# Patient Record
Sex: Female | Born: 1935 | Race: White | Hispanic: No | Smoking: Former smoker
Health system: Southern US, Community
[De-identification: ages and names within clinical notes are randomized; demographics above are authoritative.]

## PROBLEM LIST (undated history)

## (undated) DIAGNOSIS — E119 Type 2 diabetes mellitus without complications: Secondary | ICD-10-CM

## (undated) DIAGNOSIS — Z889 Allergy status to unspecified drugs, medicaments and biological substances status: Secondary | ICD-10-CM

## (undated) DIAGNOSIS — I10 Essential (primary) hypertension: Secondary | ICD-10-CM

## (undated) DIAGNOSIS — M1990 Unspecified osteoarthritis, unspecified site: Secondary | ICD-10-CM

## (undated) HISTORY — DX: Unspecified osteoarthritis, unspecified site: M19.90

## (undated) HISTORY — PX: CARPAL TUNNEL RELEASE: SHX101

## (undated) HISTORY — PX: BREAST BIOPSY: SHX20

## (undated) HISTORY — PX: ABDOMINAL HYSTERECTOMY: SHX81

## (undated) HISTORY — DX: Essential (primary) hypertension: I10

## (undated) HISTORY — PX: APPENDECTOMY: SHX54

## (undated) HISTORY — PX: BREAST LUMPECTOMY: SHX2

## (undated) HISTORY — PX: PILONIDAL CYST EXCISION: SHX744

---

## 1998-05-05 ENCOUNTER — Ambulatory Visit (HOSPITAL_COMMUNITY): Admission: RE | Admit: 1998-05-05 | Discharge: 1998-05-05 | Payer: Self-pay | Admitting: Family Medicine

## 2000-05-05 ENCOUNTER — Encounter (HOSPITAL_BASED_OUTPATIENT_CLINIC_OR_DEPARTMENT_OTHER): Payer: Self-pay | Admitting: Internal Medicine

## 2000-05-05 ENCOUNTER — Ambulatory Visit (HOSPITAL_COMMUNITY): Admission: RE | Admit: 2000-05-05 | Discharge: 2000-05-05 | Payer: Self-pay | Admitting: Endocrinology

## 2002-04-02 ENCOUNTER — Ambulatory Visit (HOSPITAL_COMMUNITY): Admission: RE | Admit: 2002-04-02 | Discharge: 2002-04-02 | Payer: Self-pay | Admitting: Internal Medicine

## 2002-04-02 ENCOUNTER — Encounter (HOSPITAL_BASED_OUTPATIENT_CLINIC_OR_DEPARTMENT_OTHER): Payer: Self-pay | Admitting: Internal Medicine

## 2005-07-26 ENCOUNTER — Ambulatory Visit (HOSPITAL_COMMUNITY): Admission: RE | Admit: 2005-07-26 | Discharge: 2005-07-26 | Payer: Self-pay | Admitting: Internal Medicine

## 2006-09-12 ENCOUNTER — Ambulatory Visit (HOSPITAL_COMMUNITY): Admission: RE | Admit: 2006-09-12 | Discharge: 2006-09-12 | Payer: Self-pay | Admitting: Internal Medicine

## 2007-10-16 ENCOUNTER — Ambulatory Visit (HOSPITAL_COMMUNITY): Admission: RE | Admit: 2007-10-16 | Discharge: 2007-10-16 | Payer: Self-pay | Admitting: Internal Medicine

## 2009-07-09 ENCOUNTER — Ambulatory Visit (HOSPITAL_COMMUNITY): Admission: RE | Admit: 2009-07-09 | Discharge: 2009-07-09 | Payer: Self-pay | Admitting: Internal Medicine

## 2010-07-27 ENCOUNTER — Ambulatory Visit (HOSPITAL_COMMUNITY): Admission: RE | Admit: 2010-07-27 | Discharge: 2010-07-27 | Payer: Self-pay | Admitting: Internal Medicine

## 2011-08-12 ENCOUNTER — Other Ambulatory Visit (HOSPITAL_COMMUNITY): Payer: Self-pay | Admitting: Internal Medicine

## 2011-08-12 DIAGNOSIS — Z1231 Encounter for screening mammogram for malignant neoplasm of breast: Secondary | ICD-10-CM

## 2011-08-23 ENCOUNTER — Ambulatory Visit (HOSPITAL_COMMUNITY)
Admission: RE | Admit: 2011-08-23 | Discharge: 2011-08-23 | Disposition: A | Discharge status: Home / Self Care | Payer: Medicare Other | Source: Ambulatory Visit | Attending: Internal Medicine | Admitting: Internal Medicine

## 2011-08-23 DIAGNOSIS — Z1231 Encounter for screening mammogram for malignant neoplasm of breast: Secondary | ICD-10-CM | POA: Insufficient documentation

## 2013-04-18 ENCOUNTER — Other Ambulatory Visit (HOSPITAL_COMMUNITY): Payer: Self-pay | Admitting: Internal Medicine

## 2013-04-18 DIAGNOSIS — Z1231 Encounter for screening mammogram for malignant neoplasm of breast: Secondary | ICD-10-CM

## 2013-05-14 ENCOUNTER — Ambulatory Visit (HOSPITAL_COMMUNITY)
Admission: RE | Admit: 2013-05-14 | Discharge: 2013-05-14 | Disposition: A | Discharge status: Home / Self Care | Payer: Medicare Other | Source: Ambulatory Visit | Attending: Internal Medicine | Admitting: Internal Medicine

## 2013-05-14 DIAGNOSIS — Z1231 Encounter for screening mammogram for malignant neoplasm of breast: Secondary | ICD-10-CM

## 2013-05-15 ENCOUNTER — Other Ambulatory Visit: Payer: Self-pay | Admitting: Internal Medicine

## 2013-05-15 DIAGNOSIS — R928 Other abnormal and inconclusive findings on diagnostic imaging of breast: Secondary | ICD-10-CM

## 2013-06-19 ENCOUNTER — Other Ambulatory Visit: Payer: Self-pay | Admitting: Internal Medicine

## 2013-06-19 ENCOUNTER — Ambulatory Visit
Admission: RE | Admit: 2013-06-19 | Discharge: 2013-06-19 | Disposition: A | Discharge status: Home / Self Care | Payer: Medicare Other | Source: Ambulatory Visit | Attending: Internal Medicine | Admitting: Internal Medicine

## 2013-06-19 DIAGNOSIS — R928 Other abnormal and inconclusive findings on diagnostic imaging of breast: Secondary | ICD-10-CM

## 2013-06-20 ENCOUNTER — Ambulatory Visit
Admission: RE | Admit: 2013-06-20 | Discharge: 2013-06-20 | Disposition: A | Discharge status: Home / Self Care | Payer: Medicare Other | Source: Ambulatory Visit | Attending: Internal Medicine | Admitting: Internal Medicine

## 2013-06-20 DIAGNOSIS — R928 Other abnormal and inconclusive findings on diagnostic imaging of breast: Secondary | ICD-10-CM

## 2013-06-21 ENCOUNTER — Other Ambulatory Visit: Payer: Self-pay | Admitting: Internal Medicine

## 2013-06-21 DIAGNOSIS — C50911 Malignant neoplasm of unspecified site of right female breast: Secondary | ICD-10-CM

## 2013-06-22 ENCOUNTER — Telehealth: Payer: Self-pay | Admitting: *Deleted

## 2013-06-22 DIAGNOSIS — C50411 Malignant neoplasm of upper-outer quadrant of right female breast: Secondary | ICD-10-CM | POA: Insufficient documentation

## 2013-06-22 NOTE — Telephone Encounter (Signed)
Called and spoke with patient and confirmed [facility] appt. For 06/27/13 at 12N.  Instructions and contact information given.  No questions or concerns at this time.

## 2013-06-26 ENCOUNTER — Ambulatory Visit
Admission: RE | Admit: 2013-06-26 | Discharge: 2013-06-26 | Disposition: A | Discharge status: Home / Self Care | Payer: Medicare Other | Source: Ambulatory Visit | Attending: Internal Medicine | Admitting: Internal Medicine

## 2013-06-26 DIAGNOSIS — C50911 Malignant neoplasm of unspecified site of right female breast: Secondary | ICD-10-CM

## 2013-06-26 MED ORDER — GADOBENATE DIMEGLUMINE 529 MG/ML IV SOLN
7.0000 mL | Freq: Once | INTRAVENOUS | Status: AC | PRN
  Administered 2013-06-26: 7 mL via INTRAVENOUS

## 2013-06-27 ENCOUNTER — Ambulatory Visit (HOSPITAL_BASED_OUTPATIENT_CLINIC_OR_DEPARTMENT_OTHER): Payer: Medicare Other | Admitting: Oncology

## 2013-06-27 ENCOUNTER — Other Ambulatory Visit (HOSPITAL_BASED_OUTPATIENT_CLINIC_OR_DEPARTMENT_OTHER): Payer: Medicare Other | Admitting: Lab

## 2013-06-27 ENCOUNTER — Ambulatory Visit: Payer: Medicare Other

## 2013-06-27 ENCOUNTER — Ambulatory Visit (HOSPITAL_BASED_OUTPATIENT_CLINIC_OR_DEPARTMENT_OTHER): Payer: Medicare Other | Admitting: General Surgery

## 2013-06-27 ENCOUNTER — Encounter: Payer: Self-pay | Admitting: *Deleted

## 2013-06-27 ENCOUNTER — Ambulatory Visit: Payer: Medicare Other | Admitting: Physical Therapy

## 2013-06-27 ENCOUNTER — Encounter: Payer: Self-pay | Admitting: Oncology

## 2013-06-27 ENCOUNTER — Encounter: Payer: Self-pay | Admitting: Radiation Oncology

## 2013-06-27 ENCOUNTER — Other Ambulatory Visit (INDEPENDENT_AMBULATORY_CARE_PROVIDER_SITE_OTHER): Payer: Self-pay | Admitting: General Surgery

## 2013-06-27 ENCOUNTER — Ambulatory Visit
Admission: RE | Admit: 2013-06-27 | Discharge: 2013-06-27 | Disposition: A | Discharge status: Home / Self Care | Payer: Medicare Other | Source: Ambulatory Visit | Attending: Radiation Oncology | Admitting: Radiation Oncology

## 2013-06-27 ENCOUNTER — Telehealth: Payer: Self-pay | Admitting: Oncology

## 2013-06-27 VITALS — BP 152/64 | HR 69 | Temp 97.4°F | Resp 20 | Ht <= 58 in | Wt 152.9 lb

## 2013-06-27 DIAGNOSIS — C50411 Malignant neoplasm of upper-outer quadrant of right female breast: Secondary | ICD-10-CM

## 2013-06-27 DIAGNOSIS — C50419 Malignant neoplasm of upper-outer quadrant of unspecified female breast: Secondary | ICD-10-CM

## 2013-06-27 DIAGNOSIS — C50911 Malignant neoplasm of unspecified site of right female breast: Secondary | ICD-10-CM

## 2013-06-27 LAB — COMPREHENSIVE METABOLIC PANEL (CC13)
Albumin: 4.1 g/dL (ref 3.5–5.0)
BUN: 34.2 mg/dL — ABNORMAL HIGH (ref 7.0–26.0)
CO2: 24 mEq/L (ref 22–29)
Calcium: 10.9 mg/dL — ABNORMAL HIGH (ref 8.4–10.4)
Chloride: 101 mEq/L (ref 98–109)
Creatinine: 1.4 mg/dL — ABNORMAL HIGH (ref 0.6–1.1)
Glucose: 103 mg/dl (ref 70–140)

## 2013-06-27 LAB — CBC WITH DIFFERENTIAL/PLATELET
Basophils Absolute: 0 10*3/uL (ref 0.0–0.1)
Eosinophils Absolute: 0.1 10*3/uL (ref 0.0–0.5)
HCT: 32.1 % — ABNORMAL LOW (ref 34.8–46.6)
HGB: 11.4 g/dL — ABNORMAL LOW (ref 11.6–15.9)
MCH: 33 pg (ref 25.1–34.0)
MONO#: 0.7 10*3/uL (ref 0.1–0.9)
NEUT#: 3.3 10*3/uL (ref 1.5–6.5)
NEUT%: 52.6 % (ref 38.4–76.8)
WBC: 6.3 10*3/uL (ref 3.9–10.3)
lymph#: 2.2 10*3/uL (ref 0.9–3.3)

## 2013-06-27 NOTE — Progress Notes (Signed)
Radiation Oncology         [phone]) [phone] ________________________________  Initial outpatient Consultation  Name: Kelly Webster MRN: [id number]  Date: 06/27/2013  DOB: 1931/10/30  CC:No primary provider on file.  Hoxworth, Lorne Skeens, MD   REFERRING PHYSICIAN: Johna Sheriff Lorne Skeens, MD  DIAGNOSIS: [id number] invasive mammary carcinoma, right breast, ER/PR positive HER-2/neu neg  HISTORY OF PRESENT ILLNESS::Kelly Webster is a 79 y.o. female who was found on screening mammography to have a right breast mass. This is in the 9:00 position and it measured 8 x 8 mm on ultrasound. She underwent an MRI and the lesion was 1.6 cm, a solitary finding. She underwent a biopsy of this mass which demonstrated invasive mammary carcinoma with carcinoma in situ. The disease is ER/PR positive HER-2/neu negative with the Ki-67 of 17%. She is planning to undergo lumpectomy. She has minimal complaints today.   PREVIOUS RADIATION THERAPY: No  PAST MEDICAL HISTORY:  has a past medical history of Hypertension and Arthritis.    PAST SURGICAL HISTORY: Past Surgical History  Procedure Laterality Date  . Appendectomy    . Abdominal hysterectomy      FAMILY HISTORY: family history includes Bone cancer in her cousin; Colon cancer in her maternal uncle and sister; and Lung cancer in her maternal uncle.  SOCIAL HISTORY:  reports that she quit smoking about 32 years ago. Her smoking use included Cigarettes. She smoked 0.25 packs per day. She does not have any smokeless tobacco history on file. She reports that she does not drink alcohol or use illicit drugs.  ALLERGIES: Demerol  MEDICATIONS:  No current outpatient prescriptions on file.   No current facility-administered medications for this encounter.    REVIEW OF SYSTEMS:  A 15 point review of systems is documented in the electronic medical record. This was obtained by the nursing staff. However, I reviewed this with the patient to discuss relevant findings  and make appropriate changes.  Pertinent items are noted in HPI.   PHYSICAL EXAM:  Vitals with Age-Percentiles 06/27/2013  Length 144.8 cm  Systolic 152  Diastolic 64  Pulse 69  Respiration 20  Weight 69.355 kg  VISIT REPORT   General: Alert and oriented, in no acute distress HEENT: Head is normocephalic.  EOMI. Oropharynx is clear. Neck: Neck is supple, no palpable cervical or supraclavicular lymphadenopathy. Abdomen: Soft, nontender, nondistended, with no rigidity or guarding. Extremities: No cyanosis or edema. Lymphatics: No concerning lymphadenopathy. Skin: No concerning lesions. BREASTS: Post biopsy changes/ecchymoses in LOQ of right breast with underlying 1.5 cm mass, possibly larger than actual tumor due to blood products Psychiatric: Judgment and insight are intact. Affect is appropriate.   LABORATORY DATA:  Lab Results  Component Value Date   WBC 6.3 06/27/2013   HGB 11.4* 06/27/2013   HCT 32.1* 06/27/2013   MCV 93.3 06/27/2013   PLT 273 06/27/2013   CMP     Component Value Date/Time   NA 136 06/27/2013 1222         RADIOGRAPHY: US Breast Right  06/19/2013   *RADIOLOGY REPORT*  Clinical Data:  Called back from screening mammogram for possible mass right breast  DIGITAL DIAGNOSTIC RIGHT MAMMOGRAM  June 19, 2013 AND RIGHT BREAST ULTRASOUND:  Comparison:  May 14, 2013, August 23, 2011, July 27, 2010  Findings:  ACR Breast Density Category scattered fibroglandular tissue  Spot compression CC and MLO views of the right breast are submitted.  There is a spiculated mass in the right breast nine o'clock  position.  Ultrasound is performed, showing angulated hypoechoic lesion at the right breast nine o'clock 7 cm from nipple measuring 0.83 x 0.82 x 0.82 cm.  This correlates to the mammographic finding.  Ultrasound right axilla demonstrates normal right axillary lymph nodes.  IMPRESSION: Highly suspicious findings.  RECOMMENDATION: Ultrasound guided core biopsy right breast mass.   I have discussed the findings and recommendations with the patient. Results were also provided in writing at the conclusion of the visit.  If applicable, a reminder letter will be sent to the patient regarding the next appointment.  BI-RADS CATEGORY 5:  Highly suggestive of malignancy - appropriate action should be taken.   Original Report Authenticated By: Sherian Rein, M.D.   Mr Breast Bilateral W Wo Contrast  06/26/2013   *RADIOLOGY REPORT*  Clinical Data:Recently diagnosed right breast invasive mammary carcinoma with mammary carcinoma in situ. Preoperative evaluation.  BUN and creatinine were obtained on site at [facility] at [address] Results:  BUN 27 mg/dL,  Creatinine 1.2 mg/dL.  BILATERAL BREAST MRI WITH AND WITHOUT CONTRAST  Technique: Multiplanar, multisequence MR images of both breasts were obtained prior to and following the intravenous administration of 7ml of Multihance.  THREE-DIMENSIONAL MR IMAGE RENDERING ON INDEPENDENT WORKSTATION: Three-dimensional MR images were rendered by post-processing  of the original MR data on an independent DynaCad workstation. The three-dimensional MR images were interpreted, and findings are reported in the following complete MRI report for this study.  Comparison:  Recent imaging examinations.  FINDINGS:  Breast composition:  b:  There are scattered areas of fibroglandular density.  Background parenchymal enhancement: Mild to moderate.  Right breast:  There is a 1.6 x 1.4 x 1.2 cm irregular mass located within the middle third of the right breast at the 9 o'clock position.  This is associated with a mixture of wash in /washout and plateau enhancement kinetics.  This is associated with clip artifact related to the recent ultrasound guided core biopsy. There is mild adjacent post biopsy change present laterally located.  There are no additional worrisome enhancing foci within the right breast.  Left breast:  No worrisome enhancing foci.  Lymph  nodes:  No findings worrisome for axillary or internal mammary adenopathy.  Ancillary findings:   no additional findings  IMPRESSION:  1.  1.6 cm irregular enhancing mass located within the middle one third of the right breast at the nine o'clock position corresponding to the recently diagnosed invasive mammary carcinoma. No evidence for adenopathy and no additional findings.  RECOMMENDATION: Treatment plan  BI-RADS CATEGORY 6:  Known biopsy-proven malignancy - appropriate action should be taken.   Original Report Authenticated By: Rolla Plate, M.D.   Mm Digital Diag Ltd R  06/19/2013   *RADIOLOGY REPORT*  Clinical Data:  Called back from screening mammogram for possible mass right breast  DIGITAL DIAGNOSTIC RIGHT MAMMOGRAM  June 19, 2013 AND RIGHT BREAST ULTRASOUND:  Comparison:  May 14, 2013, August 23, 2011, July 27, 2010  Findings:  ACR Breast Density Category scattered fibroglandular tissue  Spot compression CC and MLO views of the right breast are submitted.  There is a spiculated mass in the right breast nine o'clock position.  Ultrasound is performed, showing angulated hypoechoic lesion at the right breast nine o'clock 7 cm from nipple measuring 0.83 x 0.82 x 0.82 cm.  This correlates to the mammographic finding.  Ultrasound right axilla demonstrates normal right axillary lymph nodes.  IMPRESSION: Highly suspicious findings.  RECOMMENDATION: Ultrasound guided core biopsy right  breast mass.  I have discussed the findings and recommendations with the patient. Results were also provided in writing at the conclusion of the visit.  If applicable, a reminder letter will be sent to the patient regarding the next appointment.  BI-RADS CATEGORY 5:  Highly suggestive of malignancy - appropriate action should be taken.   Original Report Authenticated By: Sherian Rein, M.D.   Mm Digital Diagnostic Unilat R  06/19/2013   *RADIOLOGY REPORT*  Clinical Data:  Status post ultrasound guided core biopsy right  breast mass  DIGITAL DIAGNOSTIC RIGHT MAMMOGRAM  Comparison:  Previous exams.  Findings:  Films are performed following right breast ultrasound guided biopsy of mass at the 9 o'clock.  CC and lateral view of the right breast demonstrate ribbon biopsy clip in the area of concern.  IMPRESSION: Post biopsy mammogram demonstrate biopsy clip in the area concern.   Original Report Authenticated By: Sherian Rein, M.D.   Mm Radiologist Eval And Mgmt  06/20/2013   *RADIOLOGY REPORT*  ESTABLISHED PATIENT OFFICE VISIT - LEVEL II [phone])  Chief Complaint:  79 year old female returns for biopsy results of ultrasound-guided right breast biopsy. The patient has no complaints with the biopsy site.  History:  79 year old female with 8 mm irregular hypoechoic mass in the outer right breast, biopsied on 07/20 12/2012.  Exam:  The biopsy site is clean and dry with minimal ecchymosis.  Pathology: INVASIVE MAMMARY CARCINOMA AND MAMMARY CARCINOMA IN SITU.  Assessment and Plan:  Surgery/oncology consultation.  An appointment at the [facility]) has been scheduled for 06/27/2013 and the patient informed.  Bilateral breast MRI, which will be scheduled and the patient informed.   Original Report Authenticated By: Harmon Pier, M.D.   [place] Rt Breast Bx W Loc Dev 1st Lesion Img Bx Spec US Guide  06/19/2013   *RADIOLOGY REPORT*  Clinical Data:  Suspicious mass right breast for biopsy  ULTRASOUND GUIDED CORE BIOPSY OF THE right BREAST  Comparison: Previous exams.  I met with the patient and we discussed the procedure of ultrasound- guided biopsy, including benefits and alternatives.  We discussed the high likelihood of a successful procedure. We discussed the risks of the procedure, including infection, bleeding, tissue injury, clip migration, and inadequate sampling.  Informed written consent was given. The usual time-out protocol was performed immediately prior to the procedure.  Using sterile technique  and 2% Lidocaine as local anesthetic, under direct ultrasound visualization, a 14 gauge  device was used to perform biopsy of spiculated hypoechoic mass at the right breast nine o'clock using a lateral approach.  At the conclusion of the procedure a  tissue marker clip was deployed into the biopsy cavity.  Follow up 2 view mammogram was performed and dictated separately.  IMPRESSION: Ultrasound guided biopsy of right breast.  No apparent complications.   Original Report Authenticated By: Sherian Rein, M.D.      IMPRESSION/PLAN: For the patient's Stage I ER+ breast cancer, we had a thorough discussion about her options for adjuvant therapy. One option would be antiestrogen therapy as discussed with medical oncology. She would take a pill for approximately 5 years. The alternative option would be radiotherapy to the breast. We discussed the risks benefits and side effects of radiotherapy.  She understands that the side effects would likely include some skin irritation and fatigue during the weeks of radiation. There is a risk of late effects which include but are not necessarily limited to cosmetic changes and rare lung toxicity.  After  a thorough discussion, the patient is most enthusiastic about antiestrogen therapy. This is a fine choice and I will therefore see her back PRN, ie if pathologic staging raises increased concerns.  It was a pleasure meeting the patient today.    I spent 20 minutes  face to face with the patient and more than 50% of that time was spent in counseling and/or coordination of care.    __________________________________________   Lonie Peak, MD

## 2013-06-27 NOTE — Progress Notes (Signed)
Kelly Webster [id number] August 16, 1931 79 y.o. 06/27/2013 2:11 PM  CC Dr. Glenna Fellows Dr. Lonie Peak  REASON FOR CONSULTATION:  79 year old female with invasive ductal carcinoma of the right breast being seen in the multidisciplinary breast clinic for discussion of treatment options.  STAGE:   Breast cancer of upper-outer quadrant of right female breast   Primary site: Breast (Right)   Staging method: AJCC 7th Edition   Clinical: Stage IA (T1b, N0, cM0)   Summary: Stage IA (T1b, N0, cM0)  REFERRING PHYSICIAN: Dr. Sharlet Salina Hoxworth  HISTORY OF PRESENT ILLNESS:  Kelly Webster is a 79 y.o. female.  With multiple medical problems including diabetes hyperlipidemia osteoarthritis. Patient underwent a screening mammogram and was found to have a right breast mass at the 9:00 position measuring 8 mm. She subsequently had an MRI of the breasts as well and the tumor measured 1.6 cm. Patient underwent a biopsy that showed invasive ductal carcinoma with ductal carcinoma in situ. The tumor was grade 1 and was ER +100% PR +98% HER-2/neu negative with a Ki-67 of 17%. Patient's case was discussed at the multidisciplinary breast conference. Her radiology and pathology were reviewed in detail. She is now seen in the [facility] clinic for discussion of her treatment options. She is also seen by Dr. Lonie Peak and Dr. Glenna Fellows. Patient herself is without any significant complaints.   Past Medical History: Past Medical History  Diagnosis Date  . Hypertension   . Arthritis     Past Surgical History: Past Surgical History  Procedure Laterality Date  . Appendectomy    . Abdominal hysterectomy      Family History: Family History  Problem Relation Age of Onset  . Colon cancer Sister   . Colon cancer Maternal Uncle   . Lung cancer Maternal Uncle   . Bone cancer Cousin     Social History History  Substance Use Topics  . Smoking status: Former Smoker -- 0.25 packs/day    Types:  Cigarettes    Quit date: 08/28/1980  . Smokeless tobacco: Not on file  . Alcohol Use: No    Allergies: No Known Allergies  Current Medications: Current Outpatient Prescriptions  Medication Sig Dispense Refill  . aspirin 81 MG tablet Take 81 mg by mouth daily.      Marland Kitchen atenolol (TENORMIN) 50 MG tablet Take 50 mg by mouth daily.      . Cetirizine HCl (ZYRTEC PO) Take 1 tablet by mouth daily.      . Chlorpheniramine Maleate (ALLERGY RELIEF PO) Take 1 tablet by mouth daily.      . famotidine (PEPCID) 10 MG tablet Take 10 mg by mouth daily.      Marland Kitchen LORazepam (ATIVAN) 0.5 MG tablet Take 0.5 mg by mouth 2 (two) times daily as needed for anxiety.       No current facility-administered medications for this visit.    OB/GYN History:patient had menarche at age 83 she underwent menopause in 29 she had been on hormone replacement therapy for about 15 years. She discontinued them in 1995. She had her first live birth at 29. Patient has not had a colonoscopy or a bone density scan.  Fertility Discussion:n/a Prior History of Cancer: no  Health Maintenance:  Colonoscopy none Bone Density none Last PAP smear unsure  ECOG PERFORMANCE STATUS: 0 - Asymptomatic  Genetic Counseling/testing: no  REVIEW OF SYSTEMS:  A comprehensive review of systems was negative.  PHYSICAL EXAMINATION: Blood pressure 152/64, pulse 69, temperature 97.4 F (36.3  C), temperature source Oral, resp. rate 20, height 4\' 9"  (1.448 m), weight 152 lb 14.4 oz (69.355 kg).  Patient is well-developed well-nourished female she appears younger than her stated age.  She is in no acute or apparent distress HEENT exam EOMI PERRLA sclerae anicteric no conjunctival pallor oral mucosa is moist neck is supple no palpable adenopathy Lungs clear to auscultation Cardiovascular regular rate rhythm 2/6 murmur is noted Abdomen soft nontender no HSM Extremities no edema Neuro grossly normal Breast exam right breast reveals area of  ecchymosis from biopsy and a palpable hematoma no masses nipple discharge otherwise left breast no masses or nipple discharge    STUDIES/RESULTS: US Breast Right  06/19/2013   *RADIOLOGY REPORT*  Clinical Data:  Called back from screening mammogram for possible mass right breast  DIGITAL DIAGNOSTIC RIGHT MAMMOGRAM  June 19, 2013 AND RIGHT BREAST ULTRASOUND:  Comparison:  May 14, 2013, August 23, 2011, July 27, 2010  Findings:  ACR Breast Density Category scattered fibroglandular tissue  Spot compression CC and MLO views of the right breast are submitted.  There is a spiculated mass in the right breast nine o'clock position.  Ultrasound is performed, showing angulated hypoechoic lesion at the right breast nine o'clock 7 cm from nipple measuring 0.83 x 0.82 x 0.82 cm.  This correlates to the mammographic finding.  Ultrasound right axilla demonstrates normal right axillary lymph nodes.  IMPRESSION: Highly suspicious findings.  RECOMMENDATION: Ultrasound guided core biopsy right breast mass.  I have discussed the findings and recommendations with the patient. Results were also provided in writing at the conclusion of the visit.  If applicable, a reminder letter will be sent to the patient regarding the next appointment.  BI-RADS CATEGORY 5:  Highly suggestive of malignancy - appropriate action should be taken.   Original Report Authenticated By: Sherian Rein, M.D.   Mr Breast Bilateral W Wo Contrast  06/26/2013   *RADIOLOGY REPORT*  Clinical Data:Recently diagnosed right breast invasive mammary carcinoma with mammary carcinoma in situ. Preoperative evaluation.  BUN and creatinine were obtained on site at [facility] at [address] Results:  BUN 27 mg/dL,  Creatinine 1.2 mg/dL.  BILATERAL BREAST MRI WITH AND WITHOUT CONTRAST  Technique: Multiplanar, multisequence MR images of both breasts were obtained prior to and following the intravenous administration of 7ml of Multihance.   THREE-DIMENSIONAL MR IMAGE RENDERING ON INDEPENDENT WORKSTATION: Three-dimensional MR images were rendered by post-processing  of the original MR data on an independent DynaCad workstation. The three-dimensional MR images were interpreted, and findings are reported in the following complete MRI report for this study.  Comparison:  Recent imaging examinations.  FINDINGS:  Breast composition:  b:  There are scattered areas of fibroglandular density.  Background parenchymal enhancement: Mild to moderate.  Right breast:  There is a 1.6 x 1.4 x 1.2 cm irregular mass located within the middle third of the right breast at the 9 o'clock position.  This is associated with a mixture of wash in /washout and plateau enhancement kinetics.  This is associated with clip artifact related to the recent ultrasound guided core biopsy. There is mild adjacent post biopsy change present laterally located.  There are no additional worrisome enhancing foci within the right breast.  Left breast:  No worrisome enhancing foci.  Lymph nodes:  No findings worrisome for axillary or internal mammary adenopathy.  Ancillary findings:   no additional findings  IMPRESSION:  1.  1.6 cm irregular enhancing mass located within the middle  one third of the right breast at the nine o'clock position corresponding to the recently diagnosed invasive mammary carcinoma. No evidence for adenopathy and no additional findings.  RECOMMENDATION: Treatment plan  BI-RADS CATEGORY 6:  Known biopsy-proven malignancy - appropriate action should be taken.   Original Report Authenticated By: Rolla Plate, M.D.   Mm Digital Diag Ltd R  06/19/2013   *RADIOLOGY REPORT*  Clinical Data:  Called back from screening mammogram for possible mass right breast  DIGITAL DIAGNOSTIC RIGHT MAMMOGRAM  June 19, 2013 AND RIGHT BREAST ULTRASOUND:  Comparison:  May 14, 2013, August 23, 2011, July 27, 2010  Findings:  ACR Breast Density Category scattered fibroglandular tissue   Spot compression CC and MLO views of the right breast are submitted.  There is a spiculated mass in the right breast nine o'clock position.  Ultrasound is performed, showing angulated hypoechoic lesion at the right breast nine o'clock 7 cm from nipple measuring 0.83 x 0.82 x 0.82 cm.  This correlates to the mammographic finding.  Ultrasound right axilla demonstrates normal right axillary lymph nodes.  IMPRESSION: Highly suspicious findings.  RECOMMENDATION: Ultrasound guided core biopsy right breast mass.  I have discussed the findings and recommendations with the patient. Results were also provided in writing at the conclusion of the visit.  If applicable, a reminder letter will be sent to the patient regarding the next appointment.  BI-RADS CATEGORY 5:  Highly suggestive of malignancy - appropriate action should be taken.   Original Report Authenticated By: Sherian Rein, M.D.   Mm Digital Diagnostic Unilat R  06/19/2013   *RADIOLOGY REPORT*  Clinical Data:  Status post ultrasound guided core biopsy right breast mass  DIGITAL DIAGNOSTIC RIGHT MAMMOGRAM  Comparison:  Previous exams.  Findings:  Films are performed following right breast ultrasound guided biopsy of mass at the 9 o'clock.  CC and lateral view of the right breast demonstrate ribbon biopsy clip in the area of concern.  IMPRESSION: Post biopsy mammogram demonstrate biopsy clip in the area concern.   Original Report Authenticated By: Sherian Rein, M.D.   Mm Radiologist Eval And Mgmt  06/20/2013   *RADIOLOGY REPORT*  ESTABLISHED PATIENT OFFICE VISIT - LEVEL II [phone])  Chief Complaint:  79 year old female returns for biopsy results of ultrasound-guided right breast biopsy. The patient has no complaints with the biopsy site.  History:  79 year old female with 8 mm irregular hypoechoic mass in the outer right breast, biopsied on 07/20 12/2012.  Exam:  The biopsy site is clean and dry with minimal ecchymosis.  Pathology: INVASIVE MAMMARY CARCINOMA AND  MAMMARY CARCINOMA IN SITU.  Assessment and Plan:  Surgery/oncology consultation.  An appointment at the [facility]idisciplinary [facility]) has been scheduled for 06/27/2013 and the patient informed.  Bilateral breast MRI, which will be scheduled and the patient informed.   Original Report Authenticated By: Harmon Pier, M.D.   [place] Rt Breast Bx W Loc Dev 1st Lesion Img Bx Spec US Guide  06/19/2013   *RADIOLOGY REPORT*  Clinical Data:  Suspicious mass right breast for biopsy  ULTRASOUND GUIDED CORE BIOPSY OF THE right BREAST  Comparison: Previous exams.  I met with the patient and we discussed the procedure of ultrasound- guided biopsy, including benefits and alternatives.  We discussed the high likelihood of a successful procedure. We discussed the risks of the procedure, including infection, bleeding, tissue injury, clip migration, and inadequate sampling.  Informed written consent was given. The usual time-out protocol was performed immediately prior to the  procedure.  Using sterile technique and 2% Lidocaine as local anesthetic, under direct ultrasound visualization, a 14 gauge  device was used to perform biopsy of spiculated hypoechoic mass at the right breast nine o'clock using a lateral approach.  At the conclusion of the procedure a  tissue marker clip was deployed into the biopsy cavity.  Follow up 2 view mammogram was performed and dictated separately.  IMPRESSION: Ultrasound guided biopsy of right breast.  No apparent complications.   Original Report Authenticated By: Sherian Rein, M.D.     LABS:    Chemistry      Component Value Date/Time   NA 136 06/27/2013 1222   K 4.5 06/27/2013 1222   CO2 24 06/27/2013 1222   BUN 34.2* 06/27/2013 1222   CREATININE 1.4* 06/27/2013 1222      Component Value Date/Time   CALCIUM 10.9* 06/27/2013 1222   ALKPHOS 52 06/27/2013 1222   AST 20 06/27/2013 1222   ALT 18 06/27/2013 1222   BILITOT 0.36 06/27/2013 1222      Lab Results  Component  Value Date   WBC 6.3 06/27/2013   HGB 11.4* 06/27/2013   HCT 32.1* 06/27/2013   MCV 93.3 06/27/2013   PLT 273 06/27/2013   PATHOLOGY: ADDITIONAL INFORMATION: ADDENDUM: The tumor is stained for E-cadherin and is positive, supporting a ductal phenotype. (RH:ecj 06/21/2013) Zandra Abts MD Pathologist, Electronic Signature ( Signed 06/21/2013) PROGNOSTIC INDICATORS - ACIS Results: IMMUNOHISTOCHEMICAL AND MORPHOMETRIC ANALYSIS BY THE AUTOMATED CELLULAR IMAGING SYSTEM (ACIS) Estrogen Receptor: 100%, POSITIVE, STRONG STAINING INTENSITY Progesterone Receptor: 98%, POSITIVE, STRONG STAINING INTENSITY Proliferation Marker Ki67: 17% REFERENCE RANGE ESTROGEN RECEPTOR NEGATIVE <1% POSITIVE =>1% PROGESTERONE RECEPTOR NEGATIVE <1% POSITIVE =>1% All controls stained appropriately Pecola Leisure MD Pathologist, Electronic Signature ( Signed 06/22/2013) CHROMOGENIC IN-SITU HYBRIDIZATION 1 of 3 Duplicate copy FINAL for Palecek, Jacquelynne P ([id number]) ADDITIONAL INFORMATION:(continued) Results: HER-2/NEU BY CISH - NO AMPLIFICATION OF HER-2 DETECTED. RESULT RATIO OF HER2: CEP 17 SIGNALS 1.29 AVERAGE HER2 COPY NUMBER PER CELL 1.80 REFERENCE RANGE NEGATIVE HER2/Chr17 Ratio <2.0 and Average HER2 copy number <4.0 EQUIVOCAL HER2/Chr17 Ratio <2.0 and Average HER2 copy number 4.0 and <6.0 POSITIVE HER2/Chr17 Ratio >=2.0 and/or Average HER2 copy number >=6.0 Pecola Leisure MD Pathologist, Electronic Signature ( Signed 06/21/2013) FINAL DIAGNOSIS Diagnosis Breast, right, needle core biopsy, mass - INVASIVE MAMMARY CARCINOMA, SEE COMMENT. - MAMMARY CARCINOMA IN SITU. Microscopic Comment Although the grade of tumor is best assessed at resection, with these biopsies, the invasive tumor is grade I. E-Cadherin stain is pending and will be reported in an addendum. Breast prognostic studies are pending and will be reported in an addendum. The case was reviewed with Dr Raynald Blend, who concurs. (CR:kh  06-20-13) [place] RUND DO Pathologist, Electronic Signature (Case signed 06/20/2013) Specimen [place] ASSESSMENT    79 year old female with  #1 new diagnosis of stage I (T1 NX MX) invasive ductal carcinoma with ductal carcinoma in situ. She was found to have this on a screening mammogram of the right breast.The tumor measured 1.6 cm on MRI of the breasts. The tumor is ER positive PR positive HER-2/neu negative with a low Ki-67.  #2 patient's case was discussed at the multidisciplinary breast conference. Recommendation of lumpectomy followed by adjuvant antiestrogen therapy was discussed in detail. Today I met with the patient and we discussed adjuvant antiestrogen therapy. We discussed  Treatment with tamoxifen versus aromatase inhibitor.we discussed side effects of the different agents that are available to [place]. We discussed length of therapy. She understands that the treatment  would be of curative intent.  Clinical Trial Eligibility: no Multidisciplinary conference discussion yes     PLAN:    #1 patient will proceed with lumpectomy. Do to her age and good prognostic disease sentinel node will not be performed. Patient also does not want a sentinel node.  #2 postoperatively I will plan on seeing her back for consideration of antiestrogen therapy.  #3 patient was seen by Dr. Lonie Peak at this time she did not feel the patient would benefit from adjuvant radiation therapy. However definitive decision will be made at the time of her final pathology.        Discussion: Patient is being treated per NCCN breast cancer care guidelines appropriate for stage.I, sentinel node not performed per clinical decision making, radiation decision will be based on patient's final   Thank you so much for allowing me to participate in the care of Kelly Webster. I will continue to follow up the patient with you and assist in her care.  All questions were answered. The patient knows to call the clinic with  any problems, questions or concerns. We can certainly see the patient much sooner if necessary.  I spent 45 minutes counseling the patient face to face. The total time spent in the appointment was 60 minutes.  Drue Second, MD Medical/Oncology [facility] [phone] (beeper) [phone] (Office)  06/27/2013, 2:12 PM

## 2013-06-27 NOTE — Progress Notes (Signed)
Chief Complaint: New diagnosis of breast cancer  History:    Kelly Webster is a 79 y.o. postmenopausal female referred by Dr. Juel Burrow  for evaluation of recently diagnosed carcinoma of the right breast. She recently presented for a screening mamogram revealing a suspicious mass in the right breast..  Subsequent imaging included diagnostic mamogram showing a spiculated mass and ultrasound showing an 8 mm hypoechoic lesion corresponding to the mammographic abnormality..   A ultrasound guided biopsy was performed on 06/19/2013 with pathology revealing infiltrating ductal carcinoma with associated DCIS of the breast. She is seen now in [facility] for initial treatment planning.  She has experienced no breast symptoms, specifically lump, skin changes or nipple discharge..  She does not have a personal history of any previous breast problems.  MRI has confirmed a lesion measuring 1.6 cm with no other suspicious abnormalities.  Findings at that time were the following:  Tumor size: 1.6 cm  Tumor grade: 1  Estrogen Receptor: positive Progesterone Receptor: positive  Her-2 neu: negative  Lymph node status: negative Neurovascular invasion: no Lymphatic invasion: no  Past Medical History  Diagnosis Date  . Hypertension   . Arthritis     Past Surgical History  Procedure Laterality Date  . Appendectomy    . Abdominal hysterectomy      No current outpatient prescriptions on file.   No current facility-administered medications for this visit.    Family History  Problem Relation Age of Onset  . Colon cancer Sister   . Colon cancer Maternal Uncle   . Lung cancer Maternal Uncle   . Bone cancer Cousin     History   Social History  . Marital Status: Widowed    Spouse Name: N/A    Number of Children: N/A  . Years of Education: N/A   Social History Main Topics  . Smoking status: Former Smoker -- 0.25 packs/day    Types: Cigarettes    Quit date: 08/28/1980  . Smokeless tobacco: Not on file  .  Alcohol Use: No  . Drug Use: No  . Sexually Active: Not on file   Other Topics Concern  . Not on file   Social History Narrative  . No narrative on file     Review of Systems Constitutional: negative Respiratory: negative Cardiovascular: negative Gastrointestinal: positive for constipation Hematologic/lymphatic: negative Musculoskeletal:positive for arthralgias     Objective:  There were no vitals taken for this visit.  General: Alert, well-developed Caucasian female, in no distress Skin: Warm and dry without rash or infection. HEENT: No palpable masses or thyromegaly. Sclera nonicteric. Pupils equal round and reactive. Oropharynx clear. Breasts: there is bruising in the lateral right breast at the 8:30 to 9:00 position status post core biopsy. No other abnormalities palpable in either breast. Lymph nodes: No cervical, supraclavicular, or inguinal nodes palpable. Lungs: Breath sounds clear and equal without increased work of breathing Cardiovascular: Regular rate and rhythm without murmur. No JVD or edema. Peripheral pulses intact. Abdomen: Nondistended. Soft and nontender. No masses palpable. No organomegaly. No palpable hernias. Extremities: No edema or joint swelling or deformity. No chronic venous stasis changes. Neurologic: Alert and fully oriented. Gait normal.   Laboratory data:  CBC:  Lab Results  Component Value Date   WBC 6.3 06/27/2013   RBC 3.45* 06/27/2013   HGB 11.4* 06/27/2013   HCT 32.1* 06/27/2013   PLT 273 06/27/2013  ]  [facility] Labs:  Lab Results  Component Value Date   NA 136 06/27/2013   K  4.5 06/27/2013   CO2 24 06/27/2013   BUN 34.2* 06/27/2013   CREATININE 1.4* 06/27/2013   CALCIUM 10.9* 06/27/2013   PROT 7.8 06/27/2013   BILITOT 0.36 06/27/2013   ALKPHOS 52 06/27/2013   AST 20 06/27/2013   ALT 18 06/27/2013     Assessment  80 y.o. female with a new diagnosis of cancer of the the right breast lower outer quadrant.  Clinical IB, estrogen receptor  positive, progesterone receptor positive and Her2/Neu protein/oncogene negative. I discussed with the patient and family members present today initial surgical treatment options. We discussed options of breast conservation with lumpectomy or total mastectomy and sentinal lymph node biopsy/dissection. Options for reconstruction were discussed. After discussion they have elected to proceed with right partial mastectomy. I did not recommend sentinel lymph node biopsy due to the patient's age and favorable tumor. We discussed the indications and nature of the procedure, and expected recovery, in detail. Surgical risks including anesthetic complications, cardiorespiratory complications, bleeding, infection, wound healing complications, blood clots, lymphedema, local and distant recurrence and possible need for further surgery based on the final pathology was discussed and understood.  Chemotherapy, hormonal therapy and radiation therapy have been discussed. They have been provided with literature regarding the treatment of breast cancer.  All questions were answered. They understand and agree to proceed and we will go ahead with scheduling.  Plan right partial mastectomy with preop needle localization  Mariella Saa MD, FACS  06/27/2013, 1:31 PM

## 2013-06-27 NOTE — Progress Notes (Signed)
Checked in new patient with no financial issues. Didn't ask about living will/POA. Mail and phone for communication.,

## 2013-07-02 ENCOUNTER — Telehealth (INDEPENDENT_AMBULATORY_CARE_PROVIDER_SITE_OTHER): Payer: Self-pay | Admitting: *Deleted

## 2013-07-02 NOTE — Telephone Encounter (Signed)
Patient called to ask about scheduling her surgery.  Patient states "I need to know the date of it".  Explained to patient that once the MD places the orders such as Hoxworth MD did on 06/27/13 then it goes to the surgery schedulers who will call her but that can take a couple of days.  Patient states again she needs to know a date and demands to speak with [facility] CMA.  Explained to patient again that Neysa Bonito CMA is unable to schedule her surgery.  Patient accepted offer to be sent to [facility] voicemail who is a surgery scheduler however prior to transferring patient states "I want Neysa Bonito to call me too".

## 2013-07-02 NOTE — Telephone Encounter (Signed)
Called and spoke to patient.  Patient was seen at Multidisciplinary breast Clinic @ [facility] on 06/27/13.  Patient rec'd a card with my name on it and instructed to call with questions if needed.  Patient was asking about a date for her surgery.  Patient advised that the surgery schedulers will be calling her within the next day or two so they can schedule patient for NL Breast Lumpectomy.  Patient verbalized understanding and will await our surgery schedulers call.

## 2013-07-13 ENCOUNTER — Encounter: Payer: Self-pay | Admitting: *Deleted

## 2013-07-13 NOTE — Progress Notes (Signed)
Mailed after appt letter to pt. 

## 2013-07-17 ENCOUNTER — Telehealth: Payer: Self-pay | Admitting: *Deleted

## 2013-07-17 ENCOUNTER — Encounter: Payer: Self-pay | Admitting: Oncology

## 2013-07-17 NOTE — Telephone Encounter (Signed)
Called and spoke with patient and rescheduled her appt. From 08/20/13 to 08/03/13 at 1245.  Per Dr. Welton Flakes she wants to see patient 1 week after surgery.

## 2013-07-17 NOTE — Progress Notes (Signed)
Patient called and left a message to call back. I called and advised she has copay 40.00 when she comes in to see the dr. She said she has refused chemo and radiation. She said she told them her 1st visit she was not doing and wanted to know if she would be charged for the radiation visit with dr. I advised her possible but would have to check wth insurance for sure.- I verified her dob.

## 2013-07-20 ENCOUNTER — Encounter (HOSPITAL_BASED_OUTPATIENT_CLINIC_OR_DEPARTMENT_OTHER): Payer: Self-pay | Admitting: *Deleted

## 2013-07-23 ENCOUNTER — Encounter (HOSPITAL_BASED_OUTPATIENT_CLINIC_OR_DEPARTMENT_OTHER)
Admission: RE | Admit: 2013-07-23 | Discharge: 2013-07-23 | Disposition: A | Discharge status: Home / Self Care | Payer: Medicare Other | Source: Ambulatory Visit | Attending: General Surgery | Admitting: General Surgery

## 2013-07-23 DIAGNOSIS — Z01810 Encounter for preprocedural cardiovascular examination: Secondary | ICD-10-CM | POA: Insufficient documentation

## 2013-07-23 DIAGNOSIS — Z01818 Encounter for other preprocedural examination: Secondary | ICD-10-CM | POA: Insufficient documentation

## 2013-07-23 DIAGNOSIS — Z01812 Encounter for preprocedural laboratory examination: Secondary | ICD-10-CM | POA: Insufficient documentation

## 2013-07-23 LAB — BASIC METABOLIC PANEL
Chloride: 103 mEq/L (ref 96–112)
GFR calc Af Amer: 64 mL/min — ABNORMAL LOW (ref >90)
GFR calc non Af Amer: 55 mL/min — ABNORMAL LOW (ref >90)
Glucose, Bld: 107 mg/dL — ABNORMAL HIGH (ref 70–99)
Potassium: 4.9 mEq/L (ref 3.5–5.1)
Sodium: 137 mEq/L (ref 135–145)

## 2013-07-27 ENCOUNTER — Ambulatory Visit
Admission: RE | Admit: 2013-07-27 | Discharge: 2013-07-27 | Disposition: A | Discharge status: Home / Self Care | Payer: Medicare Other | Source: Ambulatory Visit | Attending: General Surgery | Admitting: General Surgery

## 2013-07-27 ENCOUNTER — Encounter (HOSPITAL_BASED_OUTPATIENT_CLINIC_OR_DEPARTMENT_OTHER): Payer: Self-pay | Admitting: Certified Registered Nurse Anesthetist

## 2013-07-27 ENCOUNTER — Encounter (HOSPITAL_BASED_OUTPATIENT_CLINIC_OR_DEPARTMENT_OTHER)
Admission: RE | Disposition: A | Discharge status: Home / Self Care | Payer: Self-pay | Source: Ambulatory Visit | Attending: General Surgery

## 2013-07-27 ENCOUNTER — Ambulatory Visit (HOSPITAL_BASED_OUTPATIENT_CLINIC_OR_DEPARTMENT_OTHER): Payer: Medicare Other | Admitting: Certified Registered Nurse Anesthetist

## 2013-07-27 ENCOUNTER — Ambulatory Visit (HOSPITAL_BASED_OUTPATIENT_CLINIC_OR_DEPARTMENT_OTHER)
Admission: RE | Admit: 2013-07-27 | Discharge: 2013-07-27 | Disposition: A | Discharge status: Home / Self Care | Payer: Medicare Other | Source: Ambulatory Visit | Attending: General Surgery | Admitting: General Surgery

## 2013-07-27 DIAGNOSIS — Z87891 Personal history of nicotine dependence: Secondary | ICD-10-CM | POA: Insufficient documentation

## 2013-07-27 DIAGNOSIS — I10 Essential (primary) hypertension: Secondary | ICD-10-CM | POA: Insufficient documentation

## 2013-07-27 DIAGNOSIS — C50911 Malignant neoplasm of unspecified site of right female breast: Secondary | ICD-10-CM

## 2013-07-27 DIAGNOSIS — C50919 Malignant neoplasm of unspecified site of unspecified female breast: Secondary | ICD-10-CM

## 2013-07-27 DIAGNOSIS — D0590 Unspecified type of carcinoma in situ of unspecified breast: Secondary | ICD-10-CM | POA: Insufficient documentation

## 2013-07-27 DIAGNOSIS — M129 Arthropathy, unspecified: Secondary | ICD-10-CM | POA: Insufficient documentation

## 2013-07-27 DIAGNOSIS — Z170 Estrogen receptor positive status [ER+]: Secondary | ICD-10-CM | POA: Insufficient documentation

## 2013-07-27 DIAGNOSIS — C50411 Malignant neoplasm of upper-outer quadrant of right female breast: Secondary | ICD-10-CM

## 2013-07-27 HISTORY — PX: BREAST LUMPECTOMY WITH NEEDLE LOCALIZATION: SHX5759

## 2013-07-27 HISTORY — DX: Type 2 diabetes mellitus without complications: E11.9

## 2013-07-27 HISTORY — DX: Allergy status to unspecified drugs, medicaments and biological substances: Z88.9

## 2013-07-27 LAB — POCT HEMOGLOBIN-HEMACUE: Hemoglobin: 10.1 g/dL — ABNORMAL LOW (ref 12.0–15.0)

## 2013-07-27 SURGERY — BREAST LUMPECTOMY WITH NEEDLE LOCALIZATION
Anesthesia: General | Site: Breast | Laterality: Right | Wound class: Clean

## 2013-07-27 MED ORDER — FENTANYL CITRATE 0.05 MG/ML IJ SOLN
25.0000 ug | INTRAMUSCULAR | Status: DC | PRN
  Administered 2013-07-27: 25 ug via INTRAVENOUS

## 2013-07-27 MED ORDER — HYDROCODONE-ACETAMINOPHEN 5-325 MG PO TABS: 1.0000 | ORAL_TABLET | ORAL | Status: DC | PRN

## 2013-07-27 MED ORDER — BUPIVACAINE-EPINEPHRINE 0.5% -1:200000 IJ SOLN
INTRAMUSCULAR | Status: DC | PRN
  Administered 2013-07-27: 30 mL

## 2013-07-27 MED ORDER — CEFAZOLIN SODIUM-DEXTROSE 2-3 GM-% IV SOLR
2.0000 g | INTRAVENOUS | Status: AC
  Administered 2013-07-27: 2 g via INTRAVENOUS

## 2013-07-27 MED ORDER — ONDANSETRON HCL 4 MG/2ML IJ SOLN: 4.0000 mg | Freq: Once | INTRAMUSCULAR | Status: DC | PRN

## 2013-07-27 MED ORDER — CHLORHEXIDINE GLUCONATE 4 % EX LIQD: 1.0000 "application " | Freq: Once | CUTANEOUS | Status: DC

## 2013-07-27 MED ORDER — MIDAZOLAM HCL 2 MG/2ML IJ SOLN: 1.0000 mg | INTRAMUSCULAR | Status: DC | PRN

## 2013-07-27 MED ORDER — FENTANYL CITRATE 0.05 MG/ML IJ SOLN
INTRAMUSCULAR | Status: DC | PRN
  Administered 2013-07-27: 25 ug via INTRAVENOUS
  Administered 2013-07-27: 50 ug via INTRAVENOUS

## 2013-07-27 MED ORDER — ACETAMINOPHEN 160 MG/5ML PO SOLN: 325.0000 mg | ORAL | Status: DC | PRN

## 2013-07-27 MED ORDER — FENTANYL CITRATE 0.05 MG/ML IJ SOLN: 50.0000 ug | INTRAMUSCULAR | Status: DC | PRN

## 2013-07-27 MED ORDER — PROPOFOL 10 MG/ML IV BOLUS
INTRAVENOUS | Status: DC | PRN
  Administered 2013-07-27: 150 mg via INTRAVENOUS

## 2013-07-27 MED ORDER — OXYCODONE HCL 5 MG/5ML PO SOLN: 5.0000 mg | Freq: Once | ORAL | Status: DC | PRN

## 2013-07-27 MED ORDER — ACETAMINOPHEN 325 MG PO TABS: 325.0000 mg | ORAL_TABLET | ORAL | Status: DC | PRN

## 2013-07-27 MED ORDER — 0.9 % SODIUM CHLORIDE (POUR BTL) OPTIME
TOPICAL | Status: DC | PRN
  Administered 2013-07-27: 200 mL

## 2013-07-27 MED ORDER — DEXAMETHASONE SODIUM PHOSPHATE 4 MG/ML IJ SOLN
INTRAMUSCULAR | Status: DC | PRN
  Administered 2013-07-27: 4 mg via INTRAVENOUS

## 2013-07-27 MED ORDER — ONDANSETRON HCL 4 MG/2ML IJ SOLN
INTRAMUSCULAR | Status: DC | PRN
  Administered 2013-07-27: 4 mg via INTRAVENOUS

## 2013-07-27 MED ORDER — LIDOCAINE HCL (CARDIAC) 20 MG/ML IV SOLN
INTRAVENOUS | Status: DC | PRN
  Administered 2013-07-27: 60 mg via INTRAVENOUS

## 2013-07-27 MED ORDER — OXYCODONE HCL 5 MG PO TABS: 5.0000 mg | ORAL_TABLET | Freq: Once | ORAL | Status: DC | PRN

## 2013-07-27 MED ORDER — LACTATED RINGERS IV SOLN
INTRAVENOUS | Status: DC
  Administered 2013-07-27 (×2): via INTRAVENOUS

## 2013-07-27 SURGICAL SUPPLY — 48 items
ADH SKN CLS APL DERMABOND .7 (GAUZE/BANDAGES/DRESSINGS) ×1
BLADE SURG 10 STRL SS (BLADE) IMPLANT
BLADE SURG 15 STRL LF DISP TIS (BLADE) ×1 IMPLANT
BLADE SURG 15 STRL SS (BLADE) ×2
CANISTER SUCTION 1200CC (MISCELLANEOUS) ×1 IMPLANT
CHLORAPREP W/TINT 26ML (MISCELLANEOUS) ×2 IMPLANT
CLIP TI MEDIUM 6 (CLIP) IMPLANT
CLIP TI WIDE RED SMALL 6 (CLIP) ×2 IMPLANT
CLOTH BEACON ORANGE TIMEOUT ST (SAFETY) ×2 IMPLANT
COVER MAYO STAND STRL (DRAPES) ×2 IMPLANT
COVER TABLE BACK 60X90 (DRAPES) ×2 IMPLANT
DERMABOND ADVANCED (GAUZE/BANDAGES/DRESSINGS) ×1
DERMABOND ADVANCED .7 DNX12 (GAUZE/BANDAGES/DRESSINGS) IMPLANT
DEVICE DUBIN W/COMP PLATE 8390 (MISCELLANEOUS) ×1 IMPLANT
DRAPE PED LAPAROTOMY (DRAPES) ×2 IMPLANT
DRAPE UTILITY XL STRL (DRAPES) ×2 IMPLANT
ELECT COATED BLADE 2.86 ST (ELECTRODE) ×2 IMPLANT
ELECT REM PT RETURN 9FT ADLT (ELECTROSURGICAL) ×2
ELECTRODE REM PT RTRN 9FT ADLT (ELECTROSURGICAL) ×1 IMPLANT
GLOVE BIO SURGEON STRL SZ 6.5 (GLOVE) ×1 IMPLANT
GLOVE BIOGEL PI IND STRL 8 (GLOVE) ×1 IMPLANT
GLOVE BIOGEL PI INDICATOR 8 (GLOVE) ×1
GLOVE EXAM NITRILE MD LF STRL (GLOVE) ×2 IMPLANT
GLOVE SS BIOGEL STRL SZ 7.5 (GLOVE) ×1 IMPLANT
GLOVE SUPERSENSE BIOGEL SZ 7.5 (GLOVE) ×1
GOWN PREVENTION PLUS XLARGE (GOWN DISPOSABLE) ×2 IMPLANT
GOWN PREVENTION PLUS XXLARGE (GOWN DISPOSABLE) ×2 IMPLANT
KIT MARKER MARGIN INK (KITS) ×1 IMPLANT
NDL HYPO 25X1 1.5 SAFETY (NEEDLE) ×1 IMPLANT
NEEDLE HYPO 25X1 1.5 SAFETY (NEEDLE) ×2 IMPLANT
NS IRRIG 1000ML POUR BTL (IV SOLUTION) ×2 IMPLANT
PACK BASIN DAY SURGERY FS (CUSTOM PROCEDURE TRAY) ×2 IMPLANT
PENCIL BUTTON HOLSTER BLD 10FT (ELECTRODE) ×2 IMPLANT
SLEEVE SCD COMPRESS KNEE MED (MISCELLANEOUS) ×2 IMPLANT
STAPLER VISISTAT 35W (STAPLE) IMPLANT
SUT MON AB 3-0 SH 27 (SUTURE)
SUT MON AB 3-0 SH27 (SUTURE) IMPLANT
SUT MON AB 5-0 PS2 18 (SUTURE) ×2 IMPLANT
SUT SILK 3 0 SH 30 (SUTURE) IMPLANT
SUT VIC AB 3-0 SH 27 (SUTURE) ×2
SUT VIC AB 3-0 SH 27X BRD (SUTURE) ×1 IMPLANT
SUT VIC AB 4-0 BRD 54 (SUTURE) IMPLANT
SYR BULB 3OZ (MISCELLANEOUS) IMPLANT
SYR CONTROL 10ML LL (SYRINGE) ×2 IMPLANT
TOWEL OR 17X24 6PK STRL BLUE (TOWEL DISPOSABLE) ×3 IMPLANT
TOWEL OR NON WOVEN STRL DISP B (DISPOSABLE) ×1 IMPLANT
TUBE CONNECTING 20X1/4 (TUBING) ×2 IMPLANT
YANKAUER SUCT BULB TIP NO VENT (SUCTIONS) ×1 IMPLANT

## 2013-07-27 NOTE — Anesthesia Procedure Notes (Signed)
Procedure Name: LMA Insertion Date/Time: 07/27/2013 10:41 AM Performed by: Havanah Nelms D Pre-anesthesia Checklist: Patient identified, Emergency Drugs available, Suction available and Patient being monitored Patient Re-evaluated:Patient Re-evaluated prior to inductionOxygen Delivery Method: Circle System Utilized Preoxygenation: Pre-oxygenation with 100% oxygen Intubation Type: IV induction Ventilation: Mask ventilation without difficulty LMA: LMA inserted LMA Size: 4.0 Number of attempts: 1 Airway Equipment and Method: bite block Placement Confirmation: positive ETCO2 Tube secured with: Tape Dental Injury: Teeth and Oropharynx as per pre-operative assessment

## 2013-07-27 NOTE — Op Note (Signed)
Preoperative Diagnosis: right breast cancer  Postoprative Diagnosis: right breast cancer  Procedure: Procedure(s): BREAST LUMPECTOMY WITH NEEDLE LOCALIZATION   Surgeon: Glenna Fellows T   Assistants: None  Anesthesia:  General LMA anesthesia  Indications: Patient is an 79 year old female with recent diagnosis of clinical T1 B. N0 invasive and in situ cancer of the right breast. We have discussed initial surgical treatment options and risks in detail documented elsewhere and elected to proceed with needle localized lumpectomy.    Procedure Detail:  Following accurate needle localization at the breast Center the patient is brought to the operating room, placed in the supine position on the operating table, and laryngeal mask general anesthesia induced. The right breast was widely sterilely prepped and draped. She received preoperative IV antibiotics. PAS were in place. Patient time out was performed and correct procedure verified. A curvilinear incision was made just anterior to the wire insertion site which was lateral. Dissection was carried down through the subcutaneous tissue to the breast capsule. The wire was brought into the incision. At this point there was a small palpable mass. A generous portion of breast tissue was excised around the shaft and the tip of the wire in an effort to obtain negative margins. The specimen mammogram showed the clip and mass centrally located in the specimen. Palpation of the specimen showed the lateral margin to be a little closer than the others that I then reexcised the lateral margin was oriented and sent for permanent pathology. The wound was infiltrated with Marcaine. It was irrigated and hemostasis obtained. The cavity was marked with clips. The deep and subcutaneous tissue was closed with interrupted 3-0 Vicryl and the skin with subcuticular 5-0 Monocryl and Dermabond. Sponge needle and instrument counts were correct.   Estimated Blood Loss:   Minimal         Drains: none  Blood Given: none          Specimens: #1 right breast lumpectomy #2 further lateral margin right breast lumpectomy        Complications:  * No complications entered in OR log *         Disposition: PACU - hemodynamically stable.         Condition: stable

## 2013-07-27 NOTE — Anesthesia Preprocedure Evaluation (Addendum)
Anesthesia Evaluation  Patient identified by MRN, date of birth, ID band Patient awake    Reviewed: Allergy & Precautions, NPO status   Airway Mallampati: II      Dental  (+) Implants and Dental Advisory Given   Pulmonary  breath sounds clear to auscultation        Cardiovascular hypertension, Pt. on medications Rhythm:Regular Rate:Normal     Neuro/Psych    GI/Hepatic   Endo/Other  diabetes, Type 2, Oral Hypoglycemic Agents  Renal/GU      Musculoskeletal   Abdominal   Peds  Hematology   Anesthesia Other Findings   Reproductive/Obstetrics                          Anesthesia Physical Anesthesia Plan  ASA: III  Anesthesia Plan: General   Post-op Pain Management:    Induction: Intravenous  Airway Management Planned: LMA  Additional Equipment:   Intra-op Plan:   Post-operative Plan:   Informed Consent: I have reviewed the patients History and Physical, chart, labs and discussed the procedure including the risks, benefits and alternatives for the proposed anesthesia with the patient or authorized representative who has indicated his/her understanding and acceptance.   Dental advisory given  Plan Discussed with: CRNA and Anesthesiologist  Anesthesia Plan Comments: (R. Breast Ca Htn Type 2 DM glucose 99  Plan GA with LMA  Kipp Brood, MD)        Anesthesia Quick Evaluation

## 2013-07-27 NOTE — Transfer of Care (Signed)
Immediate Anesthesia Transfer of Care Note  Patient: Kelly Webster  Procedure(s) Performed: Procedure(s): BREAST LUMPECTOMY WITH NEEDLE LOCALIZATION (Right)  Patient Location: PACU  Anesthesia Type:General  Level of Consciousness: awake, alert , oriented and patient cooperative  Airway & Oxygen Therapy: Patient Spontanous Breathing and Patient connected to face mask oxygen  Post-op Assessment: Report given to PACU RN and Post -op Vital signs reviewed and stable  Post vital signs: Reviewed and stable  Complications: No apparent anesthesia complications

## 2013-07-27 NOTE — H&P (View-Only) (Signed)
Chief Complaint: New diagnosis of breast cancer  History:    Kelly Webster is a 79 y.o. postmenopausal female referred by Dr. Lin  for evaluation of recently diagnosed carcinoma of the right breast. She recently presented for a screening mamogram revealing a suspicious mass in the right breast..  Subsequent imaging included diagnostic mamogram showing a spiculated mass and ultrasound showing an 8 mm hypoechoic lesion corresponding to the mammographic abnormality..   A ultrasound guided biopsy was performed on 06/19/2013 with pathology revealing infiltrating ductal carcinoma with associated DCIS of the breast. She is seen now in [facility] for initial treatment planning.  She has experienced no breast symptoms, specifically lump, skin changes or nipple discharge..  She does not have a personal history of any previous breast problems.  MRI has confirmed a lesion measuring 1.6 cm with no other suspicious abnormalities.  Findings at that time were the following:  Tumor size: 1.6 cm  Tumor grade: 1  Estrogen Receptor: positive Progesterone Receptor: positive  Her-2 neu: negative  Lymph node status: negative Neurovascular invasion: no Lymphatic invasion: no  Past Medical History  Diagnosis Date  . Hypertension   . Arthritis     Past Surgical History  Procedure Laterality Date  . Appendectomy    . Abdominal hysterectomy      No current outpatient prescriptions on file.   No current facility-administered medications for this visit.    Family History  Problem Relation Age of Onset  . Colon cancer Sister   . Colon cancer Maternal Uncle   . Lung cancer Maternal Uncle   . Bone cancer Cousin     History   Social History  . Marital Status: Widowed    Spouse Name: N/A    Number of Children: N/A  . Years of Education: N/A   Social History Main Topics  . Smoking status: Former Smoker -- 0.25 packs/day    Types: Cigarettes    Quit date: 08/28/1980  . Smokeless tobacco: Not on file  .  Alcohol Use: No  . Drug Use: No  . Sexually Active: Not on file   Other Topics Concern  . Not on file   Social History Narrative  . No narrative on file     Review of Systems Constitutional: negative Respiratory: negative Cardiovascular: negative Gastrointestinal: positive for constipation Hematologic/lymphatic: negative Musculoskeletal:positive for arthralgias     Objective:  There were no vitals taken for this visit.  General: Alert, well-developed Caucasian female, in no distress Skin: Warm and dry without rash or infection. HEENT: No palpable masses or thyromegaly. Sclera nonicteric. Pupils equal round and reactive. Oropharynx clear. Breasts: there is bruising in the lateral right breast at the 8:30 to 9:00 position status post core biopsy. No other abnormalities palpable in either breast. Lymph nodes: No cervical, supraclavicular, or inguinal nodes palpable. Lungs: Breath sounds clear and equal without increased work of breathing Cardiovascular: Regular rate and rhythm without murmur. No JVD or edema. Peripheral pulses intact. Abdomen: Nondistended. Soft and nontender. No masses palpable. No organomegaly. No palpable hernias. Extremities: No edema or joint swelling or deformity. No chronic venous stasis changes. Neurologic: Alert and fully oriented. Gait normal.   Laboratory data:  CBC:  Lab Results  Component Value Date   WBC 6.3 06/27/2013   RBC 3.45* 06/27/2013   HGB 11.4* 06/27/2013   HCT 32.1* 06/27/2013   PLT 273 06/27/2013  ]  [facility] Labs:  Lab Results  Component Value Date   NA 136 06/27/2013   K   4.5 06/27/2013   CO2 24 06/27/2013   BUN 34.2* 06/27/2013   CREATININE 1.4* 06/27/2013   CALCIUM 10.9* 06/27/2013   PROT 7.8 06/27/2013   BILITOT 0.36 06/27/2013   ALKPHOS 52 06/27/2013   AST 20 06/27/2013   ALT 18 06/27/2013     Assessment  79 y.o. female with a new diagnosis of cancer of the the right breast lower outer quadrant.  Clinical IB, estrogen receptor  positive, progesterone receptor positive and Her2/Neu protein/oncogene negative. I discussed with the patient and family members present today initial surgical treatment options. We discussed options of breast conservation with lumpectomy or total mastectomy and sentinal lymph node biopsy/dissection. Options for reconstruction were discussed. After discussion they have elected to proceed with right partial mastectomy. I did not recommend sentinel lymph node biopsy due to the patient's age and favorable tumor. We discussed the indications and nature of the procedure, and expected recovery, in detail. Surgical risks including anesthetic complications, cardiorespiratory complications, bleeding, infection, wound healing complications, blood clots, lymphedema, local and distant recurrence and possible need for further surgery based on the final pathology was discussed and understood.  Chemotherapy, hormonal therapy and radiation therapy have been discussed. They have been provided with literature regarding the treatment of breast cancer.  All questions were answered. They understand and agree to proceed and we will go ahead with scheduling.  Plan right partial mastectomy with preop needle localization  Kelly Webster T [name] MD, FACS  06/27/2013, 1:31 PM 

## 2013-07-27 NOTE — Anesthesia Postprocedure Evaluation (Signed)
  Anesthesia Post-op Note  Patient: Kelly Webster  Procedure(s) Performed: Procedure(s): BREAST LUMPECTOMY WITH NEEDLE LOCALIZATION (Right)  Patient Location: PACU  Anesthesia Type:General  Level of Consciousness: awake, alert  and oriented  Airway and Oxygen Therapy: Patient Spontanous Breathing  Post-op Pain: mild  Post-op Assessment: Post-op Vital signs reviewed  Post-op Vital Signs: stable  Complications: No apparent anesthesia complications

## 2013-07-27 NOTE — Interval H&P Note (Signed)
History and Physical Interval Note:  07/27/2013 10:28 AM  Kelly Webster  has presented today for surgery, with the diagnosis of right breast cancer  The various methods of treatment have been discussed with the patient and family. After consideration of risks, benefits and other options for treatment, the patient has consented to  Procedure(s): BREAST LUMPECTOMY WITH NEEDLE LOCALIZATION (Right) as a surgical intervention .  The patient's history has been reviewed, patient examined, no change in status, stable for surgery.  I have reviewed the patient's chart and labs.  Questions were answered to the patient's satisfaction.     [name]

## 2013-07-31 ENCOUNTER — Encounter (HOSPITAL_BASED_OUTPATIENT_CLINIC_OR_DEPARTMENT_OTHER): Payer: Self-pay | Admitting: General Surgery

## 2013-07-31 ENCOUNTER — Telehealth (INDEPENDENT_AMBULATORY_CARE_PROVIDER_SITE_OTHER): Payer: Self-pay | Admitting: General Surgery

## 2013-07-31 NOTE — Telephone Encounter (Signed)
Called pt and discussed pathology

## 2013-08-03 ENCOUNTER — Encounter: Payer: Self-pay | Admitting: Oncology

## 2013-08-03 ENCOUNTER — Telehealth: Payer: Self-pay | Admitting: Oncology

## 2013-08-03 ENCOUNTER — Ambulatory Visit (HOSPITAL_BASED_OUTPATIENT_CLINIC_OR_DEPARTMENT_OTHER): Payer: Medicare Other | Admitting: Oncology

## 2013-08-03 VITALS — BP 145/65 | HR 67 | Temp 97.9°F | Resp 18 | Ht <= 58 in | Wt 151.6 lb

## 2013-08-03 DIAGNOSIS — C50411 Malignant neoplasm of upper-outer quadrant of right female breast: Secondary | ICD-10-CM

## 2013-08-03 DIAGNOSIS — C50419 Malignant neoplasm of upper-outer quadrant of unspecified female breast: Secondary | ICD-10-CM

## 2013-08-03 MED ORDER — TAMOXIFEN CITRATE 20 MG PO TABS: 20.0000 mg | ORAL_TABLET | Freq: Every day | ORAL | Status: DC

## 2013-08-03 NOTE — Patient Instructions (Addendum)
Proceed with tamoxifen 20 mg  Daily  We discussed the pros and cons about taking the tamoxifen . More information is as below  I will see you back in 3 months  Tamoxifen oral tablet What is this medicine? TAMOXIFEN (ta MOX i fen) blocks the effects of estrogen. It is commonly used to treat breast cancer. It is also used to decrease the chance of breast cancer coming back in women who have received treatment for the disease. It may also help prevent breast cancer in women who have a high risk of developing breast cancer. This medicine may be used for other purposes; ask your health care provider or pharmacist if you have questions. What should I tell my health care provider before I take this medicine? They need to know if you have any of these conditions: -blood clots -blood disease -cataracts or impaired eyesight -endometriosis -high calcium levels -high cholesterol -irregular menstrual cycles -liver disease -stroke -uterine fibroids -an unusual or allergic reaction to tamoxifen, other medicines, foods, dyes, or preservatives -pregnant or trying to get pregnant -breast-feeding How should I use this medicine? Take this medicine by mouth with a glass of water. Follow the directions on the prescription label. You can take it with or without food. Take your medicine at regular intervals. Do not take your medicine more often than directed. Do not stop taking except on your doctor's advice. A special MedGuide will be given to you by the pharmacist with each prescription and refill. Be sure to read this information carefully each time. Talk to your pediatrician regarding the use of this medicine in children. While this drug may be prescribed for selected conditions, precautions do apply. Overdosage: If you think you have taken too much of this medicine contact a poison control center or emergency room at once. NOTE: This medicine is only for you. Do not share this medicine with others. What  if I miss a dose? If you miss a dose, take it as soon as you can. If it is almost time for your next dose, take only that dose. Do not take double or extra doses. What may interact with this medicine? -aminoglutethimide -bromocriptine -chemotherapy drugs -female hormones, like estrogens and birth control pills -letrozole -medroxyprogesterone -phenobarbital -rifampin -warfarin This list may not describe all possible interactions. Give your health care provider a list of all the medicines, herbs, non-prescription drugs, or dietary supplements you use. Also tell them if you smoke, drink alcohol, or use illegal drugs. Some items may interact with your medicine. What should I watch for while using this medicine? Visit your doctor or health care professional for regular checks on your progress. You will need regular pelvic exams, breast exams, and mammograms. If you are taking this medicine to reduce your risk of getting breast cancer, you should know that this medicine does not prevent all types of breast cancer. If breast cancer or other problems occur, there is no guarantee that it will be found at an early stage. Do not become pregnant while taking this medicine or for 2 months after stopping this medicine. Stop taking this medicine if you get pregnant or think you are pregnant and contact your doctor. This medicine may harm your unborn baby. Women who can possibly become pregnant should use birth control methods that do not use hormones during tamoxifen treatment and for 2 months after therapy has stopped. Talk with your health care provider for birth control advice. Do not breast feed while taking this medicine. What side effects  may I notice from receiving this medicine? Side effects that you should report to your doctor or health care professional as soon as possible: -changes in vision (blurred vision) -changes in your menstrual cycle -difficulty breathing or shortness of breath -difficulty  walking or talking -new breast lumps -numbness -pelvic pain or pressure -redness, blistering, peeling or loosening of the skin, including inside the mouth -skin rash or itching (hives) -sudden chest pain -swelling of lips, face, or tongue -swelling, pain or tenderness in your calf or leg -unusual bruising or bleeding -vaginal discharge that is bloody, brown, or rust -weakness -yellowing of the whites of the eyes or skin Side effects that usually do not require medical attention (report to your doctor or health care professional if they continue or are bothersome): -fatigue -hair loss, although uncommon and is usually mild -headache -hot flashes -impotence (in men) -nausea, vomiting (mild) -vaginal discharge (white or clear) This list may not describe all possible side effects. Call your doctor for medical advice about side effects. You may report side effects to FDA at 1-800-FDA-1088. Where should I keep my medicine? Keep out of the reach of children. Store at room temperature between 20 and 25 degrees C (68 and 77 degrees F). Protect from light. Keep container tightly closed. Throw away any unused medicine after the expiration date. NOTE: This sheet is a summary. It may not cover all possible information. If you have questions about this medicine, talk to your doctor, pharmacist, or health care provider.  2012, [employer]. (08/01/2008 12:01:56 PM)

## 2013-08-03 NOTE — Telephone Encounter (Signed)
, °

## 2013-08-10 ENCOUNTER — Telehealth: Payer: Self-pay | Admitting: *Deleted

## 2013-08-10 ENCOUNTER — Encounter (INDEPENDENT_AMBULATORY_CARE_PROVIDER_SITE_OTHER): Payer: Self-pay | Admitting: General Surgery

## 2013-08-10 ENCOUNTER — Encounter: Payer: Self-pay | Admitting: Oncology

## 2013-08-10 ENCOUNTER — Ambulatory Visit (INDEPENDENT_AMBULATORY_CARE_PROVIDER_SITE_OTHER): Payer: Medicare Other | Admitting: General Surgery

## 2013-08-10 VITALS — BP 130/64 | HR 68 | Resp 14 | Ht <= 58 in | Wt 150.8 lb

## 2013-08-10 DIAGNOSIS — C50411 Malignant neoplasm of upper-outer quadrant of right female breast: Secondary | ICD-10-CM

## 2013-08-10 DIAGNOSIS — C50419 Malignant neoplasm of upper-outer quadrant of unspecified female breast: Secondary | ICD-10-CM

## 2013-08-10 NOTE — Telephone Encounter (Signed)
Pt left vm requesting financial services.  I called pt back and left vm requesting pt to call back.  I also called [facility] to inform of pts concerns and gave her pt name and DOB to reach out to pt.  Left contact information for pt to return my call.

## 2013-08-10 NOTE — Progress Notes (Signed)
Kelly Webster had advised to call her. She had already called and left me a message. I called her and left a message at [phone]

## 2013-08-10 NOTE — Progress Notes (Signed)
History: Patient returns for her first office visit status post right breast lumpectomy. She is doing well without complaints. We had previously reviewed her pathology which revealed a 1.3 cm low-grade invasive ductal carcinoma with margins widely negative. She has been back to the cancer center and as per preoperative planning she has been started on tamoxifen and will not receive radiation.  Exam: Right breast incision is well healed with minimal expected induration.  Assessment and plan: Doing well postop lumpectomy. Started on tamoxifen. I will see her back for long-term followup in 6 months or sooner if needed.

## 2013-08-19 NOTE — Progress Notes (Signed)
OFFICE PROGRESS NOTE  CC  Garlan Fillers, MD [address] [facility], [place]. [place] [address] Dr. Glenna Fellows  Dr. Lonie Peak   DIAGNOSIS: 79 year old female with invasive ductal carcinoma of the right breast being seen in the multidisciplinary breast clinic  STAGE:  Breast cancer of upper-outer quadrant of right female breast  Primary site: Breast (Right)  Staging method: AJCC 7th Edition  Clinical: Stage IA (T1b, N0, cM0)  Summary: Stage IA (T1b, N0, cM0)   PRIOR THERAPY: #1Patient underwent a screening mammogram and was found to have a right breast mass at the 9:00 position measuring 8 mm. She subsequently had an MRI of the breasts as well and the tumor measured 1.6 cm. Patient underwent a biopsy that showed invasive ductal carcinoma with ductal carcinoma in situ. The tumor was grade 1 and was ER +100% PR +98% HER-2/neu negative with a Ki-67 of 17%  #2 patient underwent a right breast lumpectomy and performed on 07/27/2013. The final pathology revealed a 1.3 cm invasive ductal carcinoma ER/PR positive HER-2/neu negative with a Ki-67 of 17% pathologic staging T1 C. N0.  #3 patient to begin adjuvant antiestrogen therapy with tamoxifen 20 milligrams daily for a total of 5 years. Risks and benefits and side effects of treatment were discussed with the patient.  #4 patient will not receive radiation therapy her radiation oncology recommendations.  CURRENT THERAPY:Tamoxifen 20 mg daily   INTERVAL HISTORY: Kelly Webster 79 y.o. female returns forfollowup visit after having her right lumpectomy performed. Overall she's doing well. She tolerated the surgery without any significant problems. Her incision site is healing well. Today she is really without any complaints no nausea vomiting fevers chills night sweats headaches shortness of breath chest pains palpitations. She has some tenderness in the breast site otherwise no other problems. Remainder of  the 10 point review of systems is negative.  MEDICAL HISTORY: Past Medical History  Diagnosis Date  . Hypertension   . Diabetes mellitus without complication   . Hx of seasonal allergies   . Arthritis     gouty arthritis knees and hands    ALLERGIES:  is allergic to demerol.  MEDICATIONS:  Current Outpatient Prescriptions  Medication Sig Dispense Refill  . Ascorbic Acid (VITAMIN C) 1000 MG tablet Take 1,000 mg by mouth daily.      Marland Kitchen BIOTIN 5000 PO Take 2 tablets by mouth 2 (two) times daily.      . fenofibrate 160 MG tablet Take 160 mg by mouth daily. 1/2 of a tablet for total of 80mg  daily      . Fish Oil-Cholecalciferol (FISH OIL + D3) 1200-1000 MG-UNIT CAPS Take 2 tablets by mouth 2 (two) times daily.      Marland Kitchen GLUCOSAMINE-CHONDROITIN DS PO Take 2 tablets by mouth daily.      Marland Kitchen ibuprofen (ADVIL,MOTRIN) 200 MG tablet Take 200 mg by mouth every 6 (six) hours as needed for pain.      Marland Kitchen loratadine (CLARITIN) 10 MG tablet Take 10 mg by mouth daily.      Marland Kitchen losartan-hydrochlorothiazide (HYZAAR) 50-12.5 MG per tablet Take 1 tablet by mouth daily.      . metFORMIN (GLUCOPHAGE) 500 MG tablet Take 500 mg by mouth daily. 1/2 of tablet daily for 250mg  total      . Multiple Vitamin (MULTI-VITAMIN PO) Take 1 tablet by mouth daily.      Marland Kitchen VITAMIN D, ERGOCALCIFEROL, PO Take 10,000 Units by mouth 3 (three) times a week.      Marland Kitchen  Vitamin E 400 UNITS TABS Take 1 tablet by mouth daily.      Marland Kitchen HYDROcodone-acetaminophen (NORCO/VICODIN) 5-325 MG per tablet Take 1-2 tablets by mouth every 4 (four) hours as needed for pain.  30 tablet  1  . tamoxifen (NOLVADEX) 20 MG tablet Take 1 tablet (20 mg total) by mouth daily.  90 tablet  12   No current facility-administered medications for this visit.    SURGICAL HISTORY:  Past Surgical History  Procedure Laterality Date  . Pilonidal cyst excision      1957  . Appendectomy      1967  . Abdominal hysterectomy      1981  . Carpal tunnel release Left     1988   . Breast lumpectomy with needle localization Right 07/27/2013    Procedure: BREAST LUMPECTOMY WITH NEEDLE LOCALIZATION;  Surgeon: Mariella Saa, MD;  Location: [facility];  Service: General;  Laterality: Right;    REVIEW OF SYSTEMS:  Pertinent items are noted in HPI.   HEALTH MAINTENANCE:    PHYSICAL EXAMINATION: Blood pressure 145/65, pulse 67, temperature 97.9 F (36.6 C), temperature source Oral, resp. rate 18, height 4\' 9"  (1.448 m), weight 151 lb 9.6 oz (68.765 kg). Body mass index is 32.8 kg/(m^2). ECOG PERFORMANCE STATUS: 0 - Asymptomatic   General appearance: alert, cooperative and appears stated age Neck: no adenopathy, no carotid bruit, no JVD, supple, symmetrical, trachea midline and thyroid not enlarged, symmetric, no tenderness/mass/nodules Lymph nodes: Cervical, supraclavicular, and axillary nodes normal. Resp: clear to auscultation bilaterally and normal percussion bilaterally Back: negative, range of motion normal, symmetric, no curvature. ROM normal. No CVA tenderness. Cardio: regular rate and rhythm, S1, S2 normal, no murmur, click, rub or gallop GI: soft, non-tender; bowel sounds normal; no masses,  no organomegaly Extremities: extremities normal, atraumatic, no cyanosis or edema Neurologic: Grossly normal Right breast no masses nipple discharge Left breast reveals well-healed surgical scar no nodularity no masses.  LABORATORY DATA: Lab Results  Component Value Date   WBC 6.3 06/27/2013   HGB 10.1* 07/27/2013   HCT 32.1* 06/27/2013   MCV 93.3 06/27/2013   PLT 273 06/27/2013      Chemistry      Component Value Date/Time   NA 137 07/23/2013 0900   NA 136 06/27/2013 1222   K 4.9 07/23/2013 0900   K 4.5 06/27/2013 1222   CL 103 07/23/2013 0900   CO2 26 07/23/2013 0900   CO2 24 06/27/2013 1222   BUN 26* 07/23/2013 0900   BUN 34.2* 06/27/2013 1222   CREATININE 0.95 07/23/2013 0900   CREATININE 1.4* 06/27/2013 1222      Component Value Date/Time    CALCIUM 10.7* 07/23/2013 0900   CALCIUM 10.9* 06/27/2013 1222   ALKPHOS 52 06/27/2013 1222   AST 20 06/27/2013 1222   ALT 18 06/27/2013 1222   BILITOT 0.36 06/27/2013 1222    ADDITIONAL INFORMATION: 1. CHROMOGENIC IN-SITU HYBRIDIZATION Results: HER-2/NEU BY CISH - NO AMPLIFICATION OF HER-2 DETECTED. RESULT RATIO OF HER2: CEP 17 SIGNALS 1.32 AVERAGE HER2 COPY NUMBER PER CELL 2.90 REFERENCE RANGE NEGATIVE HER2/Chr17 Ratio <2.0 and Average HER2 copy number <4.0 EQUIVOCAL HER2/Chr17 Ratio <2.0 and Average HER2 copy number 4.0 and <6.0 POSITIVE HER2/Chr17 Ratio >=2.0 and/or Average HER2 copy number >=6.0 Jimmy Picket MD Pathologist, Electronic Signature ( Signed 08/06/2013) FINAL DIAGNOSIS Diagnosis 1. Breast, lumpectomy, right - INVASIVE DUCTAL CARCINOMA, GRADE I/III, SPANNING 1.3 CM. - THE SURGICAL RESECTION MARGINS ARE NEGATIVE FOR CARCINOMA. - SEE ONCOLOGY  TABLE BELOW. 2. Breast, excision, additional lateral margin - BENIGN BREAST PARENCHYMA. - THERE IS NO EVIDENCE OF MALIGNANCY. - SEE COMMENT. Microscopic Comment 1. BREAST, INVASIVE TUMOR, WITHOUT LYMPH NODE SAMPLING 1 of 3 FINAL for DOMINIK, LAURICELLA P [phone]) Microscopic Comment(continued) Specimen: Right breast without lymph node sampling Procedure: Lumpectomy and additional lateral margin excision Histologic type: Ductal Grade: I Tubule formation: 2 Nuclear pleomorphism: 1 Mitotic: 1 Tumor size (gross measurement): 1.3 cm Margins: Negative for carcinoma Invasive, distance to closest margin: Greater than 0.2 cm to all margins (glass slide measurements) Lymphovascular invasion: Not identified Ductal carcinoma in situ: Not identified Lobular neoplasia: Not identified Tumor focality: Unifocal Treatment effect: N/A Extent of tumor: Confined to breast parenchyma Breast prognostic profile: [id number] Estrogen receptor: 100%, strong staining intensity Progesterone receptor: 98%, strong staining intensity Her 2  neu: No amplification was detected. The ratio was 1.29. Her 2 neu by CISH will be repeated on the current case and the results reported separately. Ki-67: 17% TNM: pT1c, pNX (JBK:kh 07-31-13) 2. The surgical resection margin(s) of the specimen were inked and microscopically evaluated. Pecola Leisure MD Pathologist, Electronic Signature (Case signed 07/31/2013) Specimen Gross and Clinical Information Specimen(s) Obtained: 1. Breast, lumpectomy, right 2. Breast, excision, additional lateral margin Specimen Clinical Informatio   RADIOGRAPHIC STUDIES:  Mm Rt Plc Breast Loc Dev   1st Lesion  Inc Mammo Guide  07/27/2013   *RADIOLOGY REPORT*  Clinical Data: Recent diagnosis of right breast cancer.  NEEDLE LOCALIZATION WITH MAMMOGRAPHIC GUIDANCE AND SPECIMEN RADIOGRAPH  Comparison:  Previous exam(s).  Patient presents for needle localization prior to right breast lumpectomy. I met with the patient and we discussed the procedure of needle localization including benefits and alternatives. We discussed the high likelihood of a successful procedure. We discussed the risks of the procedure, including infection, bleeding, tissue injury, and further surgery. Informed, written consent was given. The usual time-out protocol was performed immediately prior to the procedure.  Using mammographic guidance, sterile technique, 2% lidocaine and a 7 cm modified Kopans needle, the ribbon shaped tissue marker clip and the mass in the outer right breast were localized using a lateral approach.  The films are marked for Dr. Johna Sheriff.  Specimen radiograph was performed at day Surgery and confirms that the tissue marker clip and the mass are present in the tissue sample. The specimen is marked for pathology.  IMPRESSION: Needle localization right breast.  No apparent complications.   Original Report Authenticated By: Hulan Saas, M.D.    ASSESSMENT: 79 year old female with  #1 T1 C. N0 (stage I) invasive ductal carcinoma  measuring 1.3 cm, grade 1 ER positive PR positive HER-2/neu negative with a low Ki-67. Patient is now status post lumpectomy. Postoperatively doing well.  #2 patient will begin adjuvant antiestrogen therapy with tamoxifen 20 mg daily. We've discussed risks benefits and side effects. And a prescription was sent to her pharmacy.  #3 patient will not receive radiation therapy due to minimal benefit at her age. She is comfortable with that decision.   PLAN:   #1 proceed with tamoxifen 20 mg daily.  #2 she will be seen back in 3 months time for followup or sooner if need arises.   All questions were answered. The patient knows to call the clinic with any problems, questions or concerns. We can certainly see the patient much sooner if necessary.  I spent 25 minutes counseling the patient face to face. The total time spent in the appointment was 30 minutes.    Drue Second,  MD Medical/Oncology [facility] [phone] (beeper) [phone] (Office)

## 2013-08-20 ENCOUNTER — Ambulatory Visit: Payer: Medicare Other | Admitting: Oncology

## 2013-10-29 ENCOUNTER — Telehealth: Payer: Self-pay | Admitting: Oncology

## 2013-11-05 ENCOUNTER — Ambulatory Visit: Payer: Medicare Other | Admitting: Oncology

## 2013-11-05 ENCOUNTER — Other Ambulatory Visit: Payer: Medicare Other | Admitting: Lab

## 2014-01-17 ENCOUNTER — Telehealth: Payer: Self-pay | Admitting: Oncology

## 2014-01-17 NOTE — Telephone Encounter (Signed)
, °

## 2014-01-21 ENCOUNTER — Ambulatory Visit: Payer: Medicare Other | Admitting: Oncology

## 2014-01-21 ENCOUNTER — Other Ambulatory Visit: Payer: Medicare Other

## 2014-04-15 ENCOUNTER — Ambulatory Visit (INDEPENDENT_AMBULATORY_CARE_PROVIDER_SITE_OTHER): Payer: Medicare HMO | Admitting: General Surgery

## 2014-04-15 ENCOUNTER — Encounter (INDEPENDENT_AMBULATORY_CARE_PROVIDER_SITE_OTHER): Payer: Self-pay | Admitting: General Surgery

## 2014-04-15 VITALS — BP 118/50 | HR 70 | Temp 97.7°F | Resp 16 | Ht <= 58 in | Wt 154.0 lb

## 2014-04-15 DIAGNOSIS — C50411 Malignant neoplasm of upper-outer quadrant of right female breast: Secondary | ICD-10-CM

## 2014-04-15 DIAGNOSIS — C50419 Malignant neoplasm of upper-outer quadrant of unspecified female breast: Secondary | ICD-10-CM

## 2014-04-15 NOTE — Progress Notes (Signed)
History: Patient returns for long-term followup for clinical stage IA ER PR positive invasive cancer of the right breast, status post right breast lumpectomy. She was treated with lumpectomy revealing a 1.3 cm invasive tumor. Initially she was started on tamoxifen but was unable to tolerate this due to multiple side effects and is on observation alone. She reports no problems, specifically breast lump or pain or nipple discharge.   Exam:  BP 118/50  Pulse 70  Temp(Src) 97.7 F (36.5 C)  Resp 16  Ht 4' 9.75" (1.467 m)  Wt 154 lb (69.854 kg)  BMI 32.46 kg/m2 General: Appears well Lymph nodes: No cervical supraclavicular or axillary nodes palpable Lungs: Clear equal breath sounds bilaterally Breasts: Well-healed lumpectomy incision lower outer right breast. I cannot feel any masses in either breast. No skin changes. No nipple discharge or inversion.  Imaging: Anagram due in June  Assessment and plan: doing well with no evidence of recurrent cancer. On observation alone.  Return in 6 months

## 2014-04-16 ENCOUNTER — Telehealth: Payer: Self-pay | Admitting: Hematology and Oncology

## 2014-04-16 NOTE — Telephone Encounter (Signed)
, °

## 2014-04-17 ENCOUNTER — Other Ambulatory Visit: Payer: Self-pay | Admitting: Internal Medicine

## 2014-04-17 ENCOUNTER — Telehealth: Payer: Self-pay | Admitting: Hematology and Oncology

## 2014-04-17 ENCOUNTER — Other Ambulatory Visit: Payer: Self-pay | Admitting: Adult Health

## 2014-04-17 DIAGNOSIS — Z853 Personal history of malignant neoplasm of breast: Secondary | ICD-10-CM

## 2014-04-17 NOTE — Telephone Encounter (Signed)
, °

## 2014-05-06 ENCOUNTER — Ambulatory Visit: Payer: Medicare Other | Admitting: Oncology

## 2014-05-06 ENCOUNTER — Other Ambulatory Visit: Payer: Medicare Other

## 2014-05-20 ENCOUNTER — Ambulatory Visit
Admission: RE | Admit: 2014-05-20 | Discharge: 2014-05-20 | Disposition: A | Discharge status: Home / Self Care | Payer: Medicare HMO | Source: Ambulatory Visit | Attending: Adult Health | Admitting: Adult Health

## 2014-05-20 DIAGNOSIS — Z853 Personal history of malignant neoplasm of breast: Secondary | ICD-10-CM

## 2014-07-17 ENCOUNTER — Ambulatory Visit: Payer: Medicare Other | Admitting: Hematology and Oncology

## 2014-07-17 ENCOUNTER — Other Ambulatory Visit: Payer: Medicare Other

## 2014-07-19 ENCOUNTER — Telehealth: Payer: Self-pay | Admitting: Hematology and Oncology

## 2014-07-19 NOTE — Telephone Encounter (Signed)
, °

## 2014-07-22 ENCOUNTER — Other Ambulatory Visit: Payer: Medicare Other

## 2014-07-22 ENCOUNTER — Ambulatory Visit: Payer: Medicare Other | Admitting: Hematology and Oncology

## 2014-08-27 ENCOUNTER — Ambulatory Visit: Payer: Self-pay | Admitting: Hematology and Oncology

## 2014-08-27 ENCOUNTER — Other Ambulatory Visit: Payer: Medicare HMO

## 2015-04-21 ENCOUNTER — Other Ambulatory Visit: Payer: Self-pay | Admitting: General Surgery

## 2015-04-21 ENCOUNTER — Other Ambulatory Visit: Payer: Self-pay

## 2015-04-21 DIAGNOSIS — Z853 Personal history of malignant neoplasm of breast: Secondary | ICD-10-CM

## 2015-05-26 ENCOUNTER — Ambulatory Visit
Admission: RE | Admit: 2015-05-26 | Discharge: 2015-05-26 | Disposition: A | Discharge status: Home / Self Care | Payer: PPO | Source: Ambulatory Visit | Attending: General Surgery | Admitting: General Surgery

## 2015-05-26 ENCOUNTER — Other Ambulatory Visit: Payer: Self-pay | Admitting: General Surgery

## 2015-05-26 DIAGNOSIS — Z853 Personal history of malignant neoplasm of breast: Secondary | ICD-10-CM

## 2015-06-26 ENCOUNTER — Other Ambulatory Visit: Payer: Self-pay | Admitting: Orthopedic Surgery

## 2015-06-26 DIAGNOSIS — M4726 Other spondylosis with radiculopathy, lumbar region: Secondary | ICD-10-CM

## 2015-07-01 ENCOUNTER — Ambulatory Visit
Admission: RE | Admit: 2015-07-01 | Discharge: 2015-07-01 | Disposition: A | Discharge status: Home / Self Care | Payer: PPO | Source: Ambulatory Visit | Attending: Orthopedic Surgery | Admitting: Orthopedic Surgery

## 2015-07-01 DIAGNOSIS — M4726 Other spondylosis with radiculopathy, lumbar region: Secondary | ICD-10-CM

## 2015-07-01 MED ORDER — IOHEXOL 180 MG/ML  SOLN
15.0000 mL | Freq: Once | INTRAMUSCULAR | Status: AC | PRN
  Administered 2015-07-01: 15 mL via INTRATHECAL

## 2015-07-01 MED ORDER — OXYCODONE-ACETAMINOPHEN 5-325 MG PO TABS
1.0000 | ORAL_TABLET | Freq: Once | ORAL | Status: AC
  Administered 2015-07-01: 1 via ORAL

## 2015-07-01 NOTE — Discharge Instructions (Signed)

## 2015-07-14 ENCOUNTER — Encounter (HOSPITAL_COMMUNITY): Payer: Self-pay | Admitting: Emergency Medicine

## 2015-07-14 ENCOUNTER — Emergency Department (HOSPITAL_COMMUNITY): Payer: PPO

## 2015-07-14 ENCOUNTER — Emergency Department (HOSPITAL_COMMUNITY)
Admission: EM | Admit: 2015-07-14 | Discharge: 2015-07-14 | Disposition: A | Discharge status: Home / Self Care | Payer: PPO | Source: Home / Self Care | Attending: Emergency Medicine | Admitting: Emergency Medicine

## 2015-07-14 DIAGNOSIS — R410 Disorientation, unspecified: Secondary | ICD-10-CM

## 2015-07-14 DIAGNOSIS — G92 Toxic encephalopathy: Secondary | ICD-10-CM | POA: Diagnosis not present

## 2015-07-14 LAB — COMPREHENSIVE METABOLIC PANEL
ALT: 26 U/L (ref 14–54)
AST: 28 U/L (ref 15–41)
Albumin: 4 g/dL (ref 3.5–5.0)
Alkaline Phosphatase: 46 U/L (ref 38–126)
Anion gap: 8 (ref 5–15)
BUN: 19 mg/dL (ref 6–20)
CHLORIDE: 95 mmol/L — AB (ref 101–111)
CO2: 27 mmol/L (ref 22–32)
CREATININE: 0.66 mg/dL (ref 0.44–1.00)
Calcium: 10.3 mg/dL (ref 8.9–10.3)
Glucose, Bld: 111 mg/dL — ABNORMAL HIGH (ref 65–99)
POTASSIUM: 3.7 mmol/L (ref 3.5–5.1)
SODIUM: 130 mmol/L — AB (ref 135–145)
Total Bilirubin: 0.8 mg/dL (ref 0.3–1.2)
Total Protein: 7.3 g/dL (ref 6.5–8.1)

## 2015-07-14 LAB — URINALYSIS, ROUTINE W REFLEX MICROSCOPIC
Bilirubin Urine: NEGATIVE
Glucose, UA: NEGATIVE mg/dL
Hgb urine dipstick: NEGATIVE
Ketones, ur: NEGATIVE mg/dL
NITRITE: NEGATIVE
PH: 6 (ref 5.0–8.0)
Protein, ur: NEGATIVE mg/dL
Specific Gravity, Urine: 1.006 (ref 1.005–1.030)
UROBILINOGEN UA: 0.2 mg/dL (ref 0.0–1.0)

## 2015-07-14 LAB — URINE MICROSCOPIC-ADD ON

## 2015-07-14 LAB — CBC WITH DIFFERENTIAL/PLATELET
BASOS ABS: 0 10*3/uL (ref 0.0–0.1)
Basophils Relative: 0 % (ref 0–1)
EOS PCT: 1 % (ref 0–5)
Eosinophils Absolute: 0.1 10*3/uL (ref 0.0–0.7)
HEMATOCRIT: 31.5 % — AB (ref 36.0–46.0)
HEMOGLOBIN: 11.1 g/dL — AB (ref 12.0–15.0)
LYMPHS ABS: 1.8 10*3/uL (ref 0.7–4.0)
LYMPHS PCT: 28 % (ref 12–46)
MCH: 32.4 pg (ref 26.0–34.0)
MCHC: 35.2 g/dL (ref 30.0–36.0)
MCV: 91.8 fL (ref 78.0–100.0)
Monocytes Absolute: 0.8 10*3/uL (ref 0.1–1.0)
Monocytes Relative: 12 % (ref 3–12)
NEUTROS ABS: 3.9 10*3/uL (ref 1.7–7.7)
NEUTROS PCT: 59 % (ref 43–77)
PLATELETS: 290 10*3/uL (ref 150–400)
RBC: 3.43 MIL/uL — AB (ref 3.87–5.11)
RDW: 12.9 % (ref 11.5–15.5)
WBC: 6.6 10*3/uL (ref 4.0–10.5)

## 2015-07-14 LAB — TROPONIN I

## 2015-07-14 NOTE — ED Notes (Signed)
Per pt family, pt was recently prescribed prednisone (5mg ), and oxycodone hcl (5 mg) for back pain. Pt became confused on Sat 8/13 at a store and family had to pick her up. Per family pt, has been having episodes of confusion and clarity since last week.

## 2015-07-14 NOTE — ED Provider Notes (Signed)
CSN: [id number]     Arrival date & time 07/14/15  1505 History   First MD Initiated Contact with Patient 07/14/15 1524     Chief Complaint  Patient presents with  . Altered Mental Status     (Consider location/radiation/quality/duration/timing/severity/associated sxs/prior Treatment) HPI  81 year old female history of hypertension diabetes presents to the emergency department today for altered mental status. Family is with her states that she recently started taking prednisone and since that time she's been having intermittent episodes of the performing everyday tasks that she would normally know how to do for approximately 5 days. For example patient called them from [place] and cannot figure out to get home. Patient also had trouble dialing numbers on the phone yesterday and attempted due to for 30-40 minutes without success. Between the daughter the son and the patient did deny any history of fevers chills or new abdominal pain, new back pain, no extremity pains, headache or other neurologic changes. She has chronic back pain and intermittent bilateral knee pain but these are not new. No no history of significant infections. Has not had episodes like this before while on steroids.  Past Medical History  Diagnosis Date  . Hypertension   . Diabetes mellitus without complication   . Hx of seasonal allergies   . Arthritis     gouty arthritis knees and hands   Past Surgical History  Procedure Laterality Date  . Pilonidal cyst excision      1957  . Appendectomy      1967  . Abdominal hysterectomy      1981  . Carpal tunnel release Left     1988  . Breast lumpectomy with needle localization Right 07/27/2013    Procedure: BREAST LUMPECTOMY WITH NEEDLE LOCALIZATION;  Surgeon: Edward Jolly, MD;  Location: [facility];  Service: General;  Laterality: Right;   Family History  Problem Relation Age of Onset  . Colon cancer Sister   . Colon cancer Maternal Uncle    . Lung cancer Maternal Uncle   . Bone cancer Cousin    Social History  Substance Use Topics  . Smoking status: Former Smoker -- 0.25 packs/day    Types: Cigarettes    Quit date: 08/28/1980  . Smokeless tobacco: None  . Alcohol Use: No   OB History    No data available     Review of Systems  Constitutional: Negative for fever and chills.  Eyes: Negative for pain and discharge.  Respiratory: Negative for choking and shortness of breath.   Gastrointestinal: Negative for nausea and vomiting.  Endocrine: Positive for polyuria.  Genitourinary: Negative for dysuria and difficulty urinating.  Musculoskeletal: Negative for myalgias and neck stiffness.  Skin: Negative for color change and rash.  Neurological: Negative for dizziness, seizures, syncope, facial asymmetry, numbness and headaches.  Psychiatric/Behavioral: Positive for confusion.      Allergies  Demerol  Home Medications   Prior to Admission medications   Medication Sig Start Date End Date Taking? Authorizing Provider  Ascorbic Acid (VITAMIN C) 1000 MG tablet Take 1,000 mg by mouth 2 (two) times daily.    Yes Historical Provider, MD  BIOTIN 5000 PO Take 2 tablets by mouth 2 (two) times daily.   Yes Historical Provider, MD  Fish Oil-Cholecalciferol (FISH OIL + D3) 1200-1000 MG-UNIT CAPS Take 2 tablets by mouth 2 (two) times daily.   Yes Historical Provider, MD  GLUCOSAMINE-CHONDROITIN DS PO Take 2 tablets by mouth daily.   Yes Historical Provider,  MD  ibuprofen (ADVIL,MOTRIN) 200 MG tablet Take 200 mg by mouth every 6 (six) hours as needed for pain.   Yes Historical Provider, MD  loratadine (CLARITIN) 10 MG tablet Take 10 mg by mouth daily.   Yes Historical Provider, MD  losartan-hydrochlorothiazide (HYZAAR) 50-12.5 MG per tablet Take 1 tablet by mouth daily.   Yes Historical Provider, MD  metFORMIN (GLUCOPHAGE) 500 MG tablet Take 250 mg by mouth daily.    Yes Historical Provider, MD  Multiple Vitamin (MULTI-VITAMIN PO)  Take 1 tablet by mouth daily.   Yes Historical Provider, MD  oxyCODONE (OXY IR/ROXICODONE) 5 MG immediate release tablet Take 5 mg by mouth every 12 (twelve) hours as needed for moderate pain or severe pain.  07/08/15  Yes Historical Provider, MD  Vitamin E 400 UNITS TABS Take 400 Units by mouth daily.    Yes Historical Provider, MD   BP 152/64 mmHg  Pulse 83  Temp(Src) 98.1 F (36.7 C) (Oral)  Resp 18  SpO2 95% Physical Exam  Constitutional: She appears well-developed and well-nourished.  HENT:  Head: Normocephalic and atraumatic.  Eyes: Conjunctivae and EOM are normal. Right eye exhibits no discharge. Left eye exhibits no discharge.  Cardiovascular: Normal rate and regular rhythm.   Pulmonary/Chest: Effort normal and breath sounds normal. No respiratory distress.  Abdominal: Soft. She exhibits no distension. There is no tenderness. There is no rebound.  Musculoskeletal: Normal range of motion. She exhibits no edema or tenderness.  Neurological: She is alert. She displays normal reflexes. No cranial nerve deficit. She exhibits normal muscle tone. Coordination normal.  No altered mental status, able to give full seemingly accurate history however is unsure of year or month on orientation. Also has difficulty following some simple commands. Can spell world backwards without difficulty Face is symmetric, EOM's intact, pupils equal and reactive, vision intact, tongue and uvula midline without deviation Upper and Lower extremity motor 5/5, intact pain perception in distal extremities, 2+ reflexes in biceps, patella and achilles tendons. Finger to nose normal, heel to shin normal. Walks with limp but without assistance or evident ataxia.   Skin: Skin is warm and dry.  Nursing note and vitals reviewed.   ED Course  Procedures (including critical care time) Labs Review Labs Reviewed  URINALYSIS, ROUTINE W REFLEX MICROSCOPIC (NOT AT [facility]) - Abnormal; Notable for the following:    Leukocytes, UA  MODERATE (*)    All other components within normal limits  COMPREHENSIVE METABOLIC PANEL - Abnormal; Notable for the following:    Sodium 130 (*)    Chloride 95 (*)    Glucose, Bld 111 (*)    All other components within normal limits  CBC WITH DIFFERENTIAL/PLATELET - Abnormal; Notable for the following:    RBC 3.43 (*)    Hemoglobin 11.1 (*)    HCT 31.5 (*)    All other components within normal limits  URINE CULTURE  TROPONIN I  URINE MICROSCOPIC-ADD ON    Imaging Review Dg Chest 2 View  07/14/2015   CLINICAL DATA:  Confusion  EXAM: CHEST  2 VIEW  COMPARISON:  None.  FINDINGS: Lungs are clear.  No pleural effusion or pneumothorax.  The heart is normal in size.  Degenerative changes of the visualized thoracolumbar spine.  IMPRESSION: No evidence of acute cardiopulmonary disease.   Electronically Signed   By: Julian Hy M.D.   On: 07/14/2015 17:32   Ct Head Wo Contrast  07/14/2015   CLINICAL DATA:  Per pt family, pt was  recently prescribed prednisone (5mg ), and oxycodone hcl (5 mg) for back pain. Pt became confused on Sat 8/13 at a store and family had to pick her up. Per family pt, has been having episodes of confusion and clarity since last week  EXAM: CT HEAD WITHOUT CONTRAST  TECHNIQUE: Contiguous axial images were obtained from the base of the skull through the vertex without intravenous contrast.  COMPARISON:  None.  FINDINGS: The ventricles are normal in configuration. There is age related ventricular and sulcal enlargement. No hydrocephalus.  There are no parenchymal masses or mass effect. There is no evidence of a cortical infarct. Mild white matter hypoattenuation is noted consistent with chronic microvascular ischemic change.  There are no extra-axial masses or abnormal fluid collections.  There is no intracranial hemorrhage.  Single opacified posterior right ethmoid air cell. Minor dependent mucosal thickening in the left sphenoid sinus. Remaining visualized sinuses are clear  as are the mastoid air cells. No skull lesion.  IMPRESSION: 1. No acute intracranial abnormalities. 2. Age related volume loss. Mild chronic microvascular ischemic change.   Electronically Signed   By: Lajean Manes M.D.   On: 07/14/2015 16:57   I, Shenequa Howse, Corene Cornea, personally reviewed and evaluated these images and lab results as part of my medical decision-making.   EKG Interpretation   Date/Time:  Monday July 14 2015 16:35:18 EDT Ventricular Rate:  88 PR Interval:  211 QRS Duration: 101 QT Interval:  385 QTC Calculation: 466 R Axis:   40 Text Interpretation:  Sinus rhythm similar to previous, Jul 23, 2013  Confirmed by [facility] MD, Corene Cornea [phone]) on 07/14/2015 5:39:58 PM     My ECG Read Indication:Confusion EKG was personally contemporaneously reviewed by myself. Rate: 88 PR Interval: 211 QRS duration: 101 QT/QTC: 385/466 Axis: normal EKG: normal EKG, normal sinus rhythm, 1st degree AV block. Other significant findings: none  MDM   Final diagnoses:  Confusion   Patient likely prednisone side effect however has had present for without the same symptoms so I will evaluate for other causes of her confusion to include stroke, ACS, infection or electrolyte abnormality. Also try to contact with her primary doctor for follow-up.  No obvious causes on workup for her confusion. Doubt stroke based on evaluation and CT scan. Likely medication effect, however if she doesn't improve she may need an MRI. Discussed this with the family and they will follow up with primary doctor in 1-2 days whether her symptoms are resolved or not.  I have personally and contemperaneously reviewed labs and imaging and used in my decision making as above.   A medical screening exam was performed and I feel the patient has had an appropriate workup for their chief complaint at this time and likelihood of emergent condition existing is low. They have been counseled on decision, discharge, follow up and which  symptoms necessitate immediate return to the emergency department. They or their family verbally stated understanding and agreement with plan and discharged in stable condition.      Merrily Pew, MD 07/14/15 [phone]

## 2015-07-15 ENCOUNTER — Encounter (HOSPITAL_COMMUNITY): Payer: Self-pay | Admitting: Emergency Medicine

## 2015-07-15 ENCOUNTER — Emergency Department (HOSPITAL_COMMUNITY): Payer: PPO

## 2015-07-15 ENCOUNTER — Inpatient Hospital Stay (HOSPITAL_COMMUNITY)
Admission: EM | Admit: 2015-07-15 | Discharge: 2015-07-24 | DRG: 092 | Disposition: A | Discharge status: Home Health | Payer: PPO | Attending: Internal Medicine | Admitting: Internal Medicine

## 2015-07-15 DIAGNOSIS — R4189 Other symptoms and signs involving cognitive functions and awareness: Secondary | ICD-10-CM | POA: Insufficient documentation

## 2015-07-15 DIAGNOSIS — E871 Hypo-osmolality and hyponatremia: Secondary | ICD-10-CM | POA: Diagnosis present

## 2015-07-15 DIAGNOSIS — Z79891 Long term (current) use of opiate analgesic: Secondary | ICD-10-CM

## 2015-07-15 DIAGNOSIS — Z853 Personal history of malignant neoplasm of breast: Secondary | ICD-10-CM

## 2015-07-15 DIAGNOSIS — Z800 Family history of malignant neoplasm of digestive organs: Secondary | ICD-10-CM

## 2015-07-15 DIAGNOSIS — M1000 Idiopathic gout, unspecified site: Secondary | ICD-10-CM | POA: Diagnosis present

## 2015-07-15 DIAGNOSIS — R4689 Other symptoms and signs involving appearance and behavior: Secondary | ICD-10-CM

## 2015-07-15 DIAGNOSIS — T40605A Adverse effect of unspecified narcotics, initial encounter: Secondary | ICD-10-CM | POA: Diagnosis present

## 2015-07-15 DIAGNOSIS — E119 Type 2 diabetes mellitus without complications: Secondary | ICD-10-CM

## 2015-07-15 DIAGNOSIS — Z808 Family history of malignant neoplasm of other organs or systems: Secondary | ICD-10-CM

## 2015-07-15 DIAGNOSIS — D649 Anemia, unspecified: Secondary | ICD-10-CM | POA: Diagnosis present

## 2015-07-15 DIAGNOSIS — I10 Essential (primary) hypertension: Secondary | ICD-10-CM

## 2015-07-15 DIAGNOSIS — G92 Toxic encephalopathy: Principal | ICD-10-CM | POA: Diagnosis present

## 2015-07-15 DIAGNOSIS — Z801 Family history of malignant neoplasm of trachea, bronchus and lung: Secondary | ICD-10-CM

## 2015-07-15 DIAGNOSIS — G8929 Other chronic pain: Secondary | ICD-10-CM | POA: Diagnosis present

## 2015-07-15 DIAGNOSIS — R410 Disorientation, unspecified: Secondary | ICD-10-CM

## 2015-07-15 DIAGNOSIS — Z87891 Personal history of nicotine dependence: Secondary | ICD-10-CM

## 2015-07-15 DIAGNOSIS — M549 Dorsalgia, unspecified: Secondary | ICD-10-CM | POA: Diagnosis present

## 2015-07-15 DIAGNOSIS — T380X5A Adverse effect of glucocorticoids and synthetic analogues, initial encounter: Secondary | ICD-10-CM | POA: Diagnosis present

## 2015-07-15 DIAGNOSIS — Z791 Long term (current) use of non-steroidal anti-inflammatories (NSAID): Secondary | ICD-10-CM

## 2015-07-15 DIAGNOSIS — Z79899 Other long term (current) drug therapy: Secondary | ICD-10-CM

## 2015-07-15 DIAGNOSIS — M170 Bilateral primary osteoarthritis of knee: Secondary | ICD-10-CM | POA: Diagnosis present

## 2015-07-15 DIAGNOSIS — Z885 Allergy status to narcotic agent status: Secondary | ICD-10-CM

## 2015-07-15 DIAGNOSIS — R4182 Altered mental status, unspecified: Secondary | ICD-10-CM | POA: Diagnosis present

## 2015-07-15 LAB — RAPID URINE DRUG SCREEN, HOSP PERFORMED
AMPHETAMINES: NOT DETECTED
Barbiturates: NOT DETECTED
Benzodiazepines: NOT DETECTED
Cocaine: NOT DETECTED
Opiates: NOT DETECTED
TETRAHYDROCANNABINOL: NOT DETECTED

## 2015-07-15 LAB — URINALYSIS, ROUTINE W REFLEX MICROSCOPIC
BILIRUBIN URINE: NEGATIVE
Glucose, UA: NEGATIVE mg/dL
HGB URINE DIPSTICK: NEGATIVE
Ketones, ur: NEGATIVE mg/dL
NITRITE: NEGATIVE
PROTEIN: 30 mg/dL — AB
Specific Gravity, Urine: 1.012 (ref 1.005–1.030)
UROBILINOGEN UA: 0.2 mg/dL (ref 0.0–1.0)
pH: 6 (ref 5.0–8.0)

## 2015-07-15 LAB — URINE MICROSCOPIC-ADD ON

## 2015-07-15 MED ORDER — HALOPERIDOL LACTATE 5 MG/ML IJ SOLN
2.0000 mg | Freq: Once | INTRAMUSCULAR | Status: AC
  Administered 2015-07-15: 2 mg via INTRAVENOUS
  Filled 2015-07-15: qty 1

## 2015-07-15 NOTE — ED Notes (Signed)
Pt to MRI

## 2015-07-15 NOTE — ED Notes (Signed)
Bed: CV01 Expected date:  Expected time:  Means of arrival:  Comments: EMS/44F/AMS

## 2015-07-15 NOTE — ED Notes (Addendum)
Pt from home. She lives alone, but family checks on her regularly. Pt was seen yesterday for confusion, she was medically cleared and discharged. Her family checked on her today and stated that she still "seems confused." EMS ws called and brought her here. Pt is alert and oriented and cooperative. Family reports recent bizarre repetitive behaviors (like tapping on things with her fingers) and leaving water faucets running. Pt appears calm and relaxed at this time.   Pt states she is here for back pain.

## 2015-07-15 NOTE — ED Provider Notes (Signed)
CSN: [id number]     Arrival date & time 07/15/15  2055 History   First MD Initiated Contact with Patient 07/15/15 2104     Chief Complaint  Patient presents with  . Altered Mental Status    change noticed on Sunday. Pt seen yesterday for same thing.     (Consider location/radiation/quality/duration/timing/severity/associated sxs/prior Treatment) Patient is a 81 y.o. female presenting with altered mental status. The history is provided by the patient.  Altered Mental Status Presenting symptoms: behavior changes, confusion and disorientation   Severity:  Moderate Most recent episode:  2 days ago Episode history:  Multiple Timing:  Constant Progression:  Worsening Chronicity:  Recurrent Context: recent change in medication (as described in MDM )   Context: not a recent infection   Associated symptoms: agitation (labile mood, crying) and hallucinations   Associated symptoms: no bladder incontinence     Past Medical History  Diagnosis Date  . Hypertension   . Diabetes mellitus without complication   . Hx of seasonal allergies   . Arthritis     gouty arthritis knees and hands   Past Surgical History  Procedure Laterality Date  . Pilonidal cyst excision      1957  . Appendectomy      1967  . Abdominal hysterectomy      1981  . Carpal tunnel release Left     19 88  . Breast lumpectomy with needle localization Right 07/27/2013    Procedure: BREAST LUMPECTOMY WITH NEEDLE LOCALIZATION;  Surgeon: Edward Jolly, MD;  Location: [facility];  Service: General;  Laterality: Right;   Family History  Problem Relation Age of Onset  . Colon cancer Sister   . Colon cancer Maternal Uncle   . Lung cancer Maternal Uncle   . Bone cancer Cousin    Social History  Substance Use Topics  . Smoking status: Former Smoker -- 0.25 packs/day    Types: Cigarettes    Quit date: 08/28/1980  . Smokeless tobacco: None  . Alcohol Use: No   OB History    No data available      Review of Systems  Genitourinary: Negative for bladder incontinence.  Psychiatric/Behavioral: Positive for hallucinations, confusion and agitation (labile mood, crying).  All other systems reviewed and are negative.     Allergies  Demerol  Home Medications   Prior to Admission medications   Medication Sig Start Date End Date Taking? Authorizing Provider  Ascorbic Acid (VITAMIN C) 1000 MG tablet Take 1,000 mg by mouth 2 (two) times daily.     Historical Provider, MD  BIOTIN 5000 PO Take 2 tablets by mouth 2 (two) times daily.    Historical Provider, MD  Fish Oil-Cholecalciferol (FISH OIL + D3) 1200-1000 MG-UNIT CAPS Take 2 tablets by mouth 2 (two) times daily.    Historical Provider, MD  GLUCOSAMINE-CHONDROITIN DS PO Take 2 tablets by mouth daily.    Historical Provider, MD  ibuprofen (ADVIL,MOTRIN) 200 MG tablet Take 200 mg by mouth every 6 (six) hours as needed for pain.    Historical Provider, MD  loratadine (CLARITIN) 10 MG tablet Take 10 mg by mouth daily.    Historical Provider, MD  losartan-hydrochlorothiazide (HYZAAR) 50-12.5 MG per tablet Take 1 tablet by mouth daily.    Historical Provider, MD  metFORMIN (GLUCOPHAGE) 500 MG tablet Take 250 mg by mouth daily.     Historical Provider, MD  Multiple Vitamin (MULTI-VITAMIN PO) Take 1 tablet by mouth daily.    Historical Provider, MD  oxyCODONE (OXY IR/ROXICODONE) 5 MG immediate release tablet Take 5 mg by mouth every 12 (twelve) hours as needed for moderate pain or severe pain.  07/08/15   Historical Provider, MD  Vitamin E 400 UNITS TABS Take 400 Units by mouth daily.     Historical Provider, MD   BP 130/66 mmHg  Pulse 88  Temp(Src) 97.7 F (36.5 C) (Oral)  Resp 18  SpO2 98% Physical Exam  Constitutional: She appears well-developed and well-nourished. No distress.  HENT:  Head: Normocephalic.  Eyes: Conjunctivae are normal.  Neck: Neck supple. No tracheal deviation present.  Cardiovascular: Normal rate and regular  rhythm.   Pulmonary/Chest: Effort normal. No respiratory distress.  Abdominal: Soft. She exhibits no distension.  Neurological: She is alert. She is disoriented (believes she is at home). No cranial nerve deficit. GCS eye subscore is 4. GCS verbal subscore is 4. GCS motor subscore is 6.  Unable to fully comply with neurologic exam, following basic commands but unable to follow multiple step instructions  Skin: Skin is warm and dry.  Psychiatric: Her affect is labile and inappropriate. She is actively hallucinating. Cognition and memory are impaired. She is inattentive.    ED Course  Procedures (including critical care time) Labs Review Labs Reviewed  URINALYSIS, ROUTINE W REFLEX MICROSCOPIC (NOT AT [facility]) - Abnormal; Notable for the following:    Protein, ur 30 (*)    Leukocytes, UA SMALL (*)    All other components within normal limits  URINE RAPID DRUG SCREEN, HOSP PERFORMED  URINE MICROSCOPIC-ADD ON  CBC WITH DIFFERENTIAL/PLATELET  COMPREHENSIVE METABOLIC PANEL  AMMONIA  I-STAT CG4 LACTIC ACID, ED    Imaging Review Dg Chest 2 View  07/14/2015   CLINICAL DATA:  Confusion  EXAM: CHEST  2 VIEW  COMPARISON:  None.  FINDINGS: Lungs are clear.  No pleural effusion or pneumothorax.  The heart is normal in size.  Degenerative changes of the visualized thoracolumbar spine.  IMPRESSION: No evidence of acute cardiopulmonary disease.   Electronically Signed   By: Julian Hy M.D.   On: 07/14/2015 17:32   Ct Head Wo Contrast  07/14/2015   CLINICAL DATA:  Per pt family, pt was recently prescribed prednisone (5mg ), and oxycodone hcl (5 mg) for back pain. Pt became confused on Sat 8/13 at a store and family had to pick her up. Per family pt, has been having episodes of confusion and clarity since last week  EXAM: CT HEAD WITHOUT CONTRAST  TECHNIQUE: Contiguous axial images were obtained from the base of the skull through the vertex without intravenous contrast.  COMPARISON:  None.  FINDINGS:  The ventricles are normal in configuration. There is age related ventricular and sulcal enlargement. No hydrocephalus.  There are no parenchymal masses or mass effect. There is no evidence of a cortical infarct. Mild white matter hypoattenuation is noted consistent with chronic microvascular ischemic change.  There are no extra-axial masses or abnormal fluid collections.  There is no intracranial hemorrhage.  Single opacified posterior right ethmoid air cell. Minor dependent mucosal thickening in the left sphenoid sinus. Remaining visualized sinuses are clear as are the mastoid air cells. No skull lesion.  IMPRESSION: 1. No acute intracranial abnormalities. 2. Age related volume loss. Mild chronic microvascular ischemic change.   Electronically Signed   By: Lajean Manes M.D.   On: 07/14/2015 16:57   I have personally reviewed and evaluated these images and lab results as part of my medical decision-making.   EKG Interpretation None  MDM   Final diagnoses:  Cognitive and behavioral changes  Delirium    81 year old female presenting for second visit with worsening ongoing delirium symptoms. On arrival she is actively hallucinating and talking about ducks and seeing dead relatives on the television. She is emotionally labile. She had a recent course of prednisone and oxycodone preceding her initial symptoms that was later terminated without improvement.    Patient was first noticed to have strange behavior on Saturday by her family, she normally lives alone but was found in a [employer] confused and agitated. She was at home and began dialing numbers on her telephone frantically for over half an hour continuously. She had apparently cleared her mental state in the emergency department previously and this behavior was attributed to prednisone. CT head at that time was unremarkable. Given rapidly progressive symptoms, lack of ability to care for herself, and no clear diagnosis; MR of the brain  was ordered to rule out potential frontotemporal ischemic insult or other structural abnormality. Will repeat electrolyte and infectious workup to monitor for changes from previous visit. Given timing most likely related to medication. Planned admission for ongoing progressive delirium. Hospitalist was consulted for admission and will see the patient in the emergency department.     Leo Grosser, MD 07/16/15 [phone]

## 2015-07-16 ENCOUNTER — Observation Stay (HOSPITAL_COMMUNITY)
Admit: 2015-07-16 | Discharge: 2015-07-16 | Disposition: A | Discharge status: Home / Self Care | Payer: PPO | Attending: Internal Medicine | Admitting: Internal Medicine

## 2015-07-16 DIAGNOSIS — I10 Essential (primary) hypertension: Secondary | ICD-10-CM

## 2015-07-16 DIAGNOSIS — R404 Transient alteration of awareness: Secondary | ICD-10-CM | POA: Diagnosis not present

## 2015-07-16 DIAGNOSIS — R4689 Other symptoms and signs involving appearance and behavior: Secondary | ICD-10-CM

## 2015-07-16 DIAGNOSIS — R410 Disorientation, unspecified: Secondary | ICD-10-CM

## 2015-07-16 DIAGNOSIS — E119 Type 2 diabetes mellitus without complications: Secondary | ICD-10-CM

## 2015-07-16 DIAGNOSIS — R4182 Altered mental status, unspecified: Secondary | ICD-10-CM | POA: Diagnosis not present

## 2015-07-16 DIAGNOSIS — R4189 Other symptoms and signs involving cognitive functions and awareness: Secondary | ICD-10-CM | POA: Insufficient documentation

## 2015-07-16 LAB — COMPREHENSIVE METABOLIC PANEL
ALK PHOS: 45 U/L (ref 38–126)
ALT: 24 U/L (ref 14–54)
ALT: 27 U/L (ref 14–54)
AST: 20 U/L (ref 15–41)
AST: 25 U/L (ref 15–41)
Albumin: 3.8 g/dL (ref 3.5–5.0)
Albumin: 3.9 g/dL (ref 3.5–5.0)
Alkaline Phosphatase: 44 U/L (ref 38–126)
Anion gap: 8 (ref 5–15)
Anion gap: 8 (ref 5–15)
BUN: 14 mg/dL (ref 6–20)
BUN: 16 mg/dL (ref 6–20)
CALCIUM: 9.9 mg/dL (ref 8.9–10.3)
CHLORIDE: 101 mmol/L (ref 101–111)
CHLORIDE: 100 mmol/L — AB (ref 101–111)
CO2: 26 mmol/L (ref 22–32)
CO2: 26 mmol/L (ref 22–32)
CREATININE: 0.62 mg/dL (ref 0.44–1.00)
CREATININE: 0.65 mg/dL (ref 0.44–1.00)
Calcium: 10.1 mg/dL (ref 8.9–10.3)
GFR calc Af Amer: >60 mL/min (ref >60)
GFR calc Af Amer: >60 mL/min (ref >60)
GFR calc non Af Amer: >60 mL/min (ref >60)
Glucose, Bld: 122 mg/dL — ABNORMAL HIGH (ref 65–99)
Glucose, Bld: 96 mg/dL (ref 65–99)
POTASSIUM: 3.9 mmol/L (ref 3.5–5.1)
Potassium: 3.8 mmol/L (ref 3.5–5.1)
SODIUM: 135 mmol/L (ref 135–145)
Sodium: 134 mmol/L — ABNORMAL LOW (ref 135–145)
Total Bilirubin: 0.6 mg/dL (ref 0.3–1.2)
Total Bilirubin: 0.8 mg/dL (ref 0.3–1.2)
Total Protein: 6.6 g/dL (ref 6.5–8.1)
Total Protein: 6.6 g/dL (ref 6.5–8.1)

## 2015-07-16 LAB — URINE CULTURE

## 2015-07-16 LAB — CBC
HEMATOCRIT: 31.1 % — AB (ref 36.0–46.0)
Hemoglobin: 10.5 g/dL — ABNORMAL LOW (ref 12.0–15.0)
MCH: 31.3 pg (ref 26.0–34.0)
MCHC: 33.8 g/dL (ref 30.0–36.0)
MCV: 92.6 fL (ref 78.0–100.0)
PLATELETS: 275 10*3/uL (ref 150–400)
RBC: 3.36 MIL/uL — AB (ref 3.87–5.11)
RDW: 13.1 % (ref 11.5–15.5)
WBC: 5.4 10*3/uL (ref 4.0–10.5)

## 2015-07-16 LAB — CBC WITH DIFFERENTIAL/PLATELET
BASOS ABS: 0 10*3/uL (ref 0.0–0.1)
Basophils Relative: 0 % (ref 0–1)
EOS PCT: 1 % (ref 0–5)
Eosinophils Absolute: 0.1 10*3/uL (ref 0.0–0.7)
HCT: 31.8 % — ABNORMAL LOW (ref 36.0–46.0)
HEMOGLOBIN: 10.8 g/dL — AB (ref 12.0–15.0)
LYMPHS ABS: 1.5 10*3/uL (ref 0.7–4.0)
LYMPHS PCT: 24 % (ref 12–46)
MCH: 31.3 pg (ref 26.0–34.0)
MCHC: 34 g/dL (ref 30.0–36.0)
MCV: 92.2 fL (ref 78.0–100.0)
Monocytes Absolute: 0.8 10*3/uL (ref 0.1–1.0)
Monocytes Relative: 13 % — ABNORMAL HIGH (ref 3–12)
NEUTROS ABS: 3.9 10*3/uL (ref 1.7–7.7)
NEUTROS PCT: 62 % (ref 43–77)
PLATELETS: 273 10*3/uL (ref 150–400)
RBC: 3.45 MIL/uL — AB (ref 3.87–5.11)
RDW: 13.1 % (ref 11.5–15.5)
WBC: 6.3 10*3/uL (ref 4.0–10.5)

## 2015-07-16 LAB — I-STAT CG4 LACTIC ACID, ED: LACTIC ACID, VENOUS: 0.62 mmol/L (ref 0.5–2.0)

## 2015-07-16 LAB — FERRITIN: Ferritin: 203 ng/mL (ref 11–307)

## 2015-07-16 LAB — OCCULT BLOOD X 1 CARD TO LAB, STOOL: Fecal Occult Bld: NEGATIVE

## 2015-07-16 LAB — GLUCOSE, CAPILLARY
GLUCOSE-CAPILLARY: 108 mg/dL — AB (ref 65–99)
GLUCOSE-CAPILLARY: 105 mg/dL — AB (ref 65–99)
Glucose-Capillary: 100 mg/dL — ABNORMAL HIGH (ref 65–99)

## 2015-07-16 LAB — RPR: RPR: NONREACTIVE

## 2015-07-16 LAB — TSH: TSH: 0.827 u[IU]/mL (ref 0.350–4.500)

## 2015-07-16 LAB — IRON AND TIBC
Iron: 52 ug/dL (ref 28–170)
Saturation Ratios: 17 % (ref 10.4–31.8)
TIBC: 304 ug/dL (ref 250–450)
UIBC: 252 ug/dL

## 2015-07-16 LAB — AMMONIA: AMMONIA: 45 umol/L — AB (ref 9–35)

## 2015-07-16 LAB — VITAMIN B12: Vitamin B-12: 656 pg/mL (ref 180–914)

## 2015-07-16 MED ORDER — HYDROCHLOROTHIAZIDE 12.5 MG PO CAPS
12.5000 mg | ORAL_CAPSULE | Freq: Every day | ORAL | Status: DC
  Administered 2015-07-16 – 2015-07-24 (×9): 12.5 mg via ORAL
  Filled 2015-07-16 (×9): qty 1

## 2015-07-16 MED ORDER — FOLIC ACID 1 MG PO TABS
1.0000 mg | ORAL_TABLET | Freq: Every day | ORAL | Status: DC
  Administered 2015-07-16 – 2015-07-24 (×9): 1 mg via ORAL
  Filled 2015-07-16 (×9): qty 1

## 2015-07-16 MED ORDER — ADULT MULTIVITAMIN W/MINERALS CH
1.0000 | ORAL_TABLET | Freq: Every day | ORAL | Status: DC
  Administered 2015-07-16 – 2015-07-24 (×9): 1 via ORAL
  Filled 2015-07-16 (×9): qty 1

## 2015-07-16 MED ORDER — ALLOPURINOL 300 MG PO TABS
300.0000 mg | ORAL_TABLET | Freq: Every day | ORAL | Status: DC
  Administered 2015-07-16 – 2015-07-24 (×9): 300 mg via ORAL
  Filled 2015-07-16 (×9): qty 1

## 2015-07-16 MED ORDER — VITAMIN B-1 100 MG PO TABS
100.0000 mg | ORAL_TABLET | Freq: Every day | ORAL | Status: DC
  Administered 2015-07-16 – 2015-07-24 (×9): 100 mg via ORAL
  Filled 2015-07-16 (×9): qty 1

## 2015-07-16 MED ORDER — METFORMIN HCL 500 MG PO TABS
250.0000 mg | ORAL_TABLET | Freq: Every day | ORAL | Status: DC
  Administered 2015-07-16 – 2015-07-24 (×9): 250 mg via ORAL
  Filled 2015-07-16 (×9): qty 1

## 2015-07-16 MED ORDER — ACETAMINOPHEN 325 MG PO TABS
650.0000 mg | ORAL_TABLET | Freq: Four times a day (QID) | ORAL | Status: DC | PRN
  Administered 2015-07-18 – 2015-07-23 (×3): 650 mg via ORAL
  Filled 2015-07-16 (×3): qty 2

## 2015-07-16 MED ORDER — PREDNISONE 2.5 MG PO TABS: 2.5000 mg | ORAL_TABLET | ORAL | Status: DC

## 2015-07-16 MED ORDER — SODIUM CHLORIDE 0.9 % IV SOLN
INTRAVENOUS | Status: DC
  Administered 2015-07-16 (×2): via INTRAVENOUS

## 2015-07-16 MED ORDER — HEPARIN SODIUM (PORCINE) 5000 UNIT/ML IJ SOLN
5000.0000 [IU] | Freq: Three times a day (TID) | INTRAMUSCULAR | Status: DC
  Administered 2015-07-16 – 2015-07-24 (×24): 5000 [IU] via SUBCUTANEOUS
  Filled 2015-07-16 (×26): qty 1

## 2015-07-16 MED ORDER — INSULIN ASPART 100 UNIT/ML ~~LOC~~ SOLN
0.0000 [IU] | Freq: Three times a day (TID) | SUBCUTANEOUS | Status: DC
Start: 2015-07-16 — End: 2015-07-24
  Administered 2015-07-23: 2 [IU] via SUBCUTANEOUS
  Administered 2015-07-24: 3 [IU] via SUBCUTANEOUS

## 2015-07-16 MED ORDER — SODIUM CHLORIDE 0.9 % IJ SOLN
3.0000 mL | Freq: Two times a day (BID) | INTRAMUSCULAR | Status: DC
  Administered 2015-07-16 – 2015-07-22 (×10): 3 mL via INTRAVENOUS

## 2015-07-16 MED ORDER — ACETAMINOPHEN 650 MG RE SUPP: 650.0000 mg | Freq: Four times a day (QID) | RECTAL | Status: DC | PRN

## 2015-07-16 MED ORDER — LOSARTAN POTASSIUM-HCTZ 50-12.5 MG PO TABS: 1.0000 | ORAL_TABLET | Freq: Every day | ORAL | Status: DC

## 2015-07-16 MED ORDER — LOSARTAN POTASSIUM 50 MG PO TABS
50.0000 mg | ORAL_TABLET | Freq: Every day | ORAL | Status: DC
  Administered 2015-07-16 – 2015-07-24 (×9): 50 mg via ORAL
  Filled 2015-07-16 (×9): qty 1

## 2015-07-16 MED ORDER — COLCHICINE 0.6 MG PO TABS
0.6000 mg | ORAL_TABLET | Freq: Every day | ORAL | Status: DC
  Administered 2015-07-16 – 2015-07-24 (×9): 0.6 mg via ORAL
  Filled 2015-07-16 (×9): qty 1

## 2015-07-16 NOTE — H&P (Signed)
[facility] History and Physical  VERNIE VINCIGUERRA VZC:[id number] DOB: [birth year] DOA: 07/15/2015  Referring physician: Leo Grosser, MD PCP: Donnajean Lopes, MD   Chief Complaint: Altered Mental Status  HPI: Kelly Webster is a 81 y.o. female with a past history of DM HTN presents with altered mental status. Patient was seen yesterday for the same complaints in the ED. At that time she seemed to improve so was taken home. She had been on prednisone for the last week and also had been on oxycodone for back pain. She apparently went to a dollar store and bought items with no way to pay for them. Her family was called as she was not acting normal. She has been seeing things on the walls and seeing relatives that are deceased. She has not had any headaches noted. She has no chest pain noted. She has no syncope noted. She has not been combative. She has no focal deficits. She has not had any fevers noted. She had not had any urinary complaints. Normally she is capable of taking care of her ADLs. In the ED she has been given Haldol and is therefore somnolent and sleeping at that time that she was seen. When I did wake her she told me she was in [facility] and that the year was 2003.   Review of Systems:  Patient is not able to provide a ROS as she is somnolent  Past Medical History  Diagnosis Date  . Hypertension   . Diabetes mellitus without complication   . Hx of seasonal allergies   . Arthritis     gouty arthritis knees and hands   Past Surgical History  Procedure Laterality Date  . Pilonidal cyst excision      1957  . Appendectomy      1967  . Abdominal hysterectomy      1981  . Carpal tunnel release Left     1988  . Breast lumpectomy with needle localization Right 07/27/2013    Procedure: BREAST LUMPECTOMY WITH NEEDLE LOCALIZATION;  Surgeon: Edward Jolly, MD;  Location: [facility];  Service: General;  Laterality: Right;   Social History:  reports  that she quit smoking about 34 years ago. Her smoking use included Cigarettes. She smoked 0.25 packs per day. She does not have any smokeless tobacco history on file. She reports that she does not drink alcohol or use illicit drugs.  Allergies  Allergen Reactions  . Demerol [Meperidine] Nausea Only    Family History  Problem Relation Age of Onset  . Colon cancer Sister   . Colon cancer Maternal Uncle   . Lung cancer Maternal Uncle   . Bone cancer Cousin      Prior to Admission medications   Medication Sig Start Date End Date Taking? Authorizing Provider  Ascorbic Acid (VITAMIN C) 1000 MG tablet Take 1,000 mg by mouth 2 (two) times daily.     Historical Provider, MD  BIOTIN 5000 PO Take 2 tablets by mouth 2 (two) times daily.    Historical Provider, MD  Fish Oil-Cholecalciferol (FISH OIL + D3) 1200-1000 MG-UNIT CAPS Take 2 tablets by mouth 2 (two) times daily.    Historical Provider, MD  GLUCOSAMINE-CHONDROITIN DS PO Take 2 tablets by mouth daily.    Historical Provider, MD  ibuprofen (ADVIL,MOTRIN) 200 MG tablet Take 200 mg by mouth every 6 (six) hours as needed for pain.    Historical Provider, MD  loratadine (CLARITIN) 10 MG tablet Take 10 mg  by mouth daily.    Historical Provider, MD  losartan-hydrochlorothiazide (HYZAAR) 50-12.5 MG per tablet Take 1 tablet by mouth daily.    Historical Provider, MD  metFORMIN (GLUCOPHAGE) 500 MG tablet Take 250 mg by mouth daily.     Historical Provider, MD  Multiple Vitamin (MULTI-VITAMIN PO) Take 1 tablet by mouth daily.    Historical Provider, MD  oxyCODONE (OXY IR/ROXICODONE) 5 MG immediate release tablet Take 5 mg by mouth every 12 (twelve) hours as needed for moderate pain or severe pain.  07/08/15   Historical Provider, MD  Vitamin E 400 UNITS TABS Take 400 Units by mouth daily.     Historical Provider, MD   Physical Exam: Filed Vitals:   07/15/15 2107 07/15/15 2109  BP:  130/66  Pulse:  88  Temp:  97.7 F (36.5 C)  TempSrc:  Oral  Resp:   18  SpO2: 98% 98%    Wt Readings from Last 3 Encounters:  07/15/15 69.854 kg (154 lb)  04/15/14 69.854 kg (154 lb)  08/10/13 68.402 kg (150 lb 12.8 oz)    General:  Appears comfortable Eyes: PERRL, normal lids, irises & conjunctiva ENT: grossly normal hearing, lips & tongue Neck: no LAD, masses or thyromegaly Cardiovascular: RRR, no m/r/g. No LE edema. Respiratory: CTA bilaterally, no w/r/r. Normal respiratory effort. Abdomen: soft, ntnd Skin: no rash or induration seen on limited exam Musculoskeletal: grossly normal tone BUE/BLE Psychiatric: speech not pressured and answers questions when aroused Neurologic: grossly moving all 4 extremities gait not checked          Labs on Admission:  Basic Metabolic Panel:  Recent Labs Lab 07/14/15 1631  NA 130*  K 3.7  CL 95*  CO2 27  GLUCOSE 111*  BUN 19  CREATININE 0.66  CALCIUM 10.3   Liver Function Tests:  Recent Labs Lab 07/14/15 1631  AST 28  ALT 26  ALKPHOS 46  BILITOT 0.8  PROT 7.3  ALBUMIN 4.0   No results for input(s): LIPASE, AMYLASE in the last 168 hours. No results for input(s): AMMONIA in the last 168 hours. CBC:  Recent Labs Lab 07/14/15 1631  WBC 6.6  NEUTROABS 3.9  HGB 11.1*  HCT 31.5*  MCV 91.8  PLT 290   Cardiac Enzymes:  Recent Labs Lab 07/14/15 1631  TROPONINI <0.03    BNP (last 3 results) No results for input(s): BNP in the last 8760 hours.  ProBNP (last 3 results) No results for input(s): PROBNP in the last 8760 hours.  CBG: No results for input(s): GLUCAP in the last 168 hours.  Radiological Exams on Admission: Dg Chest 2 View  07/14/2015   CLINICAL DATA:  Confusion  EXAM: CHEST  2 VIEW  COMPARISON:  None.  FINDINGS: Lungs are clear.  No pleural effusion or pneumothorax.  The heart is normal in size.  Degenerative changes of the visualized thoracolumbar spine.  IMPRESSION: No evidence of acute cardiopulmonary disease.   Electronically Signed   By: Julian Hy M.D.    On: 07/14/2015 17:32   Ct Head Wo Contrast  07/14/2015   CLINICAL DATA:  Per pt family, pt was recently prescribed prednisone (5mg ), and oxycodone hcl (5 mg) for back pain. Pt became confused on Sat 8/13 at a store and family had to pick her up. Per family pt, has been having episodes of confusion and clarity since last week  EXAM: CT HEAD WITHOUT CONTRAST  TECHNIQUE: Contiguous axial images were obtained from the base of the skull through  the vertex without intravenous contrast.  COMPARISON:  None.  FINDINGS: The ventricles are normal in configuration. There is age related ventricular and sulcal enlargement. No hydrocephalus.  There are no parenchymal masses or mass effect. There is no evidence of a cortical infarct. Mild white matter hypoattenuation is noted consistent with chronic microvascular ischemic change.  There are no extra-axial masses or abnormal fluid collections.  There is no intracranial hemorrhage.  Single opacified posterior right ethmoid air cell. Minor dependent mucosal thickening in the left sphenoid sinus. Remaining visualized sinuses are clear as are the mastoid air cells. No skull lesion.  IMPRESSION: 1. No acute intracranial abnormalities. 2. Age related volume loss. Mild chronic microvascular ischemic change.   Electronically Signed   By: Lajean Manes M.D.   On: 07/14/2015 16:57      Assessment/Plan Principal Problem:   Altered mental status Active Problems:   HTN (hypertension)   Diabetes mellitus without complication   1. Altered Mental Status -new onset could be related to medication effect -will admit for observation -CT head shows chronic vascular changes no bleed -MRI ordered results to follow (she has a history of breast cancer) -will get carotid doppler -will hold off on all narcotics and prednisone -orderd TSH RPR and B12  2. HTN -will continue with lisinopril. -monitor pressures  3. DM without complications -will get Z6X -FSBS with SSI  coverage -continue metformin  4. Hyponatremia -started on IVF NS -will repeat in am  5. Anemia -will check stool guaics -iron studies B12 Folate    Code Status: full code (must indicate code status--if unknown or must be presumed, indicate so) DVT Prophylaxis:heparin Family Communication: none (indicate person spoken with, if applicable, with phone number if by telephone) Disposition Plan: home (indicate anticipated LOS)  Time spent: 66min  Breanah Faddis A [facility] Pager [phone]

## 2015-07-16 NOTE — Care Management Note (Signed)
Case Management Note  Patient Details  Name: Kelly Webster MRN: [id number] Date of Birth: Mar 03, 1931  Subjective/Objective: 81 y/o f admitted w/AMS. From home, alone. Recommend PT cons.                   Action/Plan:d/c plan home.   Expected Discharge Date:                  Expected Discharge Plan:  [facility]  In-House Referral:     Discharge planning Services  CM Consult  Post Acute Care Choice:    Choice offered to:     DME Arranged:    DME Agency:     HH Arranged:    [place] Agency:     Status of Service:  In process, will continue to follow  Medicare Important Message Given:    Date Medicare IM Given:    Medicare IM give by:    Date Additional Medicare IM Given:    Additional Medicare Important Message give by:     If discussed at [place] of Stay Meetings, dates discussed:    Additional Comments:  Dessa Phi, RN 07/16/2015, 12:56 PM

## 2015-07-16 NOTE — Progress Notes (Signed)
5:41 PM I agree with HPI/GPe and A/P per Dr. Humphrey Rolls   13 yr ? stage IA ER PR positive invasive cancer of the right breast, status post right breast lumpectomy. She was treated with lumpectomy revealing a 1.3 cm invasive tumor  Recently seen in Ed and sent home Was on Prednisone and has been on Oxycodone for LBP -Family states that 5-6 weeks prior to admission patient was having significant back pain and started on a bunch of doses of Solu-Medrol, oxycodone. She was placed on these medications and recently as late as 07/07/15 tapered off of her steroids. Since then she has been confused and not completely herself She's been more fidgety and has had significant episodic repetitive movements and cannot tell the date the time the year Family states that this memory issue is chronic but this presentation is more subacute Over the past one half year the son relates that patient has been somewhat confused and missing things     HEENT CHEST CARDIAC ABDOMEN NEURO SKIN/MUSCULAR  Patient Active Problem List   Diagnosis Date Noted  . HTN (hypertension) 07/16/2015  . Diabetes mellitus without complication 19/37/9024  . Altered mental status 07/16/2015  . Altered mental state 07/16/2015  . Breast cancer of upper-outer quadrant of right female breast 09/73/5329   Toxic metabolic encephalopathy  steroids and pain meds were being taken inconsistently and son relates that later in conversation that the patient claimed to taken one days worth of oxycodone but 6 pills were missing Patient also found prednisone pills at the bedside when patient came to taken them  We will hold all medications for now and reassess the patient in the morning If she gets agitated we may need to use Risperdal Versus Vistaril to calm her down although I will be cautious with the latter.  Verneita Griffes, MD [facility] [phone]

## 2015-07-16 NOTE — Progress Notes (Signed)
*  PRELIMINARY RESULTS* Vascular Ultrasound Carotid Duplex (Doppler) has been completed.  Preliminary findings: Bilateral:  1-39% ICA stenosis.  Vertebral artery flow is antegrade.      Landry Mellow, RDMS, RVT  07/16/2015, 9:26 AM

## 2015-07-17 DIAGNOSIS — Z808 Family history of malignant neoplasm of other organs or systems: Secondary | ICD-10-CM | POA: Diagnosis not present

## 2015-07-17 DIAGNOSIS — T40605A Adverse effect of unspecified narcotics, initial encounter: Secondary | ICD-10-CM | POA: Diagnosis present

## 2015-07-17 DIAGNOSIS — Z791 Long term (current) use of non-steroidal anti-inflammatories (NSAID): Secondary | ICD-10-CM | POA: Diagnosis not present

## 2015-07-17 DIAGNOSIS — R404 Transient alteration of awareness: Secondary | ICD-10-CM | POA: Diagnosis not present

## 2015-07-17 DIAGNOSIS — R410 Disorientation, unspecified: Secondary | ICD-10-CM | POA: Diagnosis not present

## 2015-07-17 DIAGNOSIS — Z801 Family history of malignant neoplasm of trachea, bronchus and lung: Secondary | ICD-10-CM | POA: Diagnosis not present

## 2015-07-17 DIAGNOSIS — D649 Anemia, unspecified: Secondary | ICD-10-CM | POA: Diagnosis present

## 2015-07-17 DIAGNOSIS — E119 Type 2 diabetes mellitus without complications: Secondary | ICD-10-CM | POA: Diagnosis present

## 2015-07-17 DIAGNOSIS — M549 Dorsalgia, unspecified: Secondary | ICD-10-CM | POA: Diagnosis present

## 2015-07-17 DIAGNOSIS — I10 Essential (primary) hypertension: Secondary | ICD-10-CM | POA: Diagnosis present

## 2015-07-17 DIAGNOSIS — T380X5A Adverse effect of glucocorticoids and synthetic analogues, initial encounter: Secondary | ICD-10-CM | POA: Diagnosis present

## 2015-07-17 DIAGNOSIS — Z800 Family history of malignant neoplasm of digestive organs: Secondary | ICD-10-CM | POA: Diagnosis not present

## 2015-07-17 DIAGNOSIS — Z79899 Other long term (current) drug therapy: Secondary | ICD-10-CM | POA: Diagnosis not present

## 2015-07-17 DIAGNOSIS — Z885 Allergy status to narcotic agent status: Secondary | ICD-10-CM | POA: Diagnosis not present

## 2015-07-17 DIAGNOSIS — R4189 Other symptoms and signs involving cognitive functions and awareness: Secondary | ICD-10-CM | POA: Diagnosis not present

## 2015-07-17 DIAGNOSIS — Z87891 Personal history of nicotine dependence: Secondary | ICD-10-CM | POA: Diagnosis not present

## 2015-07-17 DIAGNOSIS — Z79891 Long term (current) use of opiate analgesic: Secondary | ICD-10-CM | POA: Diagnosis not present

## 2015-07-17 DIAGNOSIS — G92 Toxic encephalopathy: Secondary | ICD-10-CM | POA: Diagnosis present

## 2015-07-17 DIAGNOSIS — G8929 Other chronic pain: Secondary | ICD-10-CM | POA: Diagnosis present

## 2015-07-17 DIAGNOSIS — M170 Bilateral primary osteoarthritis of knee: Secondary | ICD-10-CM | POA: Diagnosis present

## 2015-07-17 DIAGNOSIS — Z853 Personal history of malignant neoplasm of breast: Secondary | ICD-10-CM | POA: Diagnosis not present

## 2015-07-17 DIAGNOSIS — E871 Hypo-osmolality and hyponatremia: Secondary | ICD-10-CM | POA: Diagnosis present

## 2015-07-17 DIAGNOSIS — M1000 Idiopathic gout, unspecified site: Secondary | ICD-10-CM | POA: Diagnosis present

## 2015-07-17 LAB — BASIC METABOLIC PANEL
ANION GAP: 6 (ref 5–15)
BUN: 15 mg/dL (ref 6–20)
CALCIUM: 10.1 mg/dL (ref 8.9–10.3)
CO2: 26 mmol/L (ref 22–32)
CREATININE: 0.7 mg/dL (ref 0.44–1.00)
Chloride: 104 mmol/L (ref 101–111)
GFR calc non Af Amer: >60 mL/min (ref >60)
Glucose, Bld: 106 mg/dL — ABNORMAL HIGH (ref 65–99)
Potassium: 4 mmol/L (ref 3.5–5.1)
SODIUM: 136 mmol/L (ref 135–145)

## 2015-07-17 LAB — GLUCOSE, CAPILLARY
GLUCOSE-CAPILLARY: 106 mg/dL — AB (ref 65–99)
GLUCOSE-CAPILLARY: 110 mg/dL — AB (ref 65–99)
GLUCOSE-CAPILLARY: 111 mg/dL — AB (ref 65–99)
GLUCOSE-CAPILLARY: 94 mg/dL (ref 65–99)
GLUCOSE-CAPILLARY: 98 mg/dL (ref 65–99)

## 2015-07-17 LAB — CBC
HCT: 30.5 % — ABNORMAL LOW (ref 36.0–46.0)
HEMOGLOBIN: 10.1 g/dL — AB (ref 12.0–15.0)
MCH: 30.8 pg (ref 26.0–34.0)
MCHC: 33.1 g/dL (ref 30.0–36.0)
MCV: 93 fL (ref 78.0–100.0)
Platelets: 252 10*3/uL (ref 150–400)
RBC: 3.28 MIL/uL — AB (ref 3.87–5.11)
RDW: 13 % (ref 11.5–15.5)
WBC: 4.8 10*3/uL (ref 4.0–10.5)

## 2015-07-17 LAB — FOLATE RBC
FOLATE, HEMOLYSATE: 561 ng/mL
Folate, RBC: 1792 ng/mL (ref >498)
Hematocrit: 31.3 % — ABNORMAL LOW (ref 34.0–46.6)

## 2015-07-17 LAB — HEMOGLOBIN A1C
Hgb A1c MFr Bld: 6.1 % — ABNORMAL HIGH (ref 4.8–5.6)
Mean Plasma Glucose: 128 mg/dL

## 2015-07-17 MED ORDER — RISPERIDONE 0.5 MG PO TBDP
0.5000 mg | ORAL_TABLET | Freq: Once | ORAL | Status: AC
  Administered 2015-07-17: 0.5 mg via ORAL
  Filled 2015-07-17: qty 1

## 2015-07-17 MED ORDER — QUETIAPINE FUMARATE 25 MG PO TABS
25.0000 mg | ORAL_TABLET | Freq: Every day | ORAL | Status: DC
  Administered 2015-07-17 – 2015-07-23 (×7): 25 mg via ORAL
  Filled 2015-07-17 (×7): qty 1

## 2015-07-17 NOTE — Consult Note (Signed)
[facility] Consult   Reason for Consult:  confusion and disorientation Referring Physician:  Dr. Izora Gala Patient Identification: Kelly Webster MRN:  [id number] Principal Diagnosis: Delirium Diagnosis:   Patient Active Problem List   Diagnosis Date Noted  . HTN (hypertension) [I10] 07/16/2015  . Diabetes mellitus without complication [C58.8] 50/27/7412  . Altered mental status [R41.82] 07/16/2015  . Altered mental state [R41.82] 07/16/2015  . Cognitive and behavioral changes [R41.89, F91.9]   . Delirium [R41.0]   . Breast cancer of upper-outer quadrant of right female breast [C50.411] 06/22/2013    Total Time spent with patient: 1 hour  Subjective:   Kelly Webster is a 81 y.o. female patient admitted with recent onset of confusion and disorientation.  HPI:  Kelly Webster is a 81 y.o. female , retired Museum/gallery exhibitions officer from [employer] admitted with altered mental status. Patient reportedly has been confused and disoriented for the last 4 days. Patient reported she has been suffering with back pain over 4 months and her pain medication is not controlling the they want to do further testing in the hospital. Meanwhile patient was treated with Steroids and pain medication. Patient reportedly living by herself and able to manage her personal effects and financial budget until a few days ago.  Patient has few bizarre behaviors and also has some magical thinking as per her daughter and son-in-law who is at bedside. Because the nature of these symptoms which are short-term, may be delirium secondary to medication management at this time. Patient will be reevaluated further to see an improvement before she was discharged a the home or out of the home placement. Case discussed with psychiatric clinical social service and patient family. Patient has poor responses for questions related to current orientation. She is confused about a year, month, date and name of the  floor and could not subtract 7 series but able to calm down members from 10-1 and also names of the week back and forth. Patient has a good long-term memory, short-term memory fine immediately but 1 out of 3 delayed. Patient has fine language functions.  HPI Elements:   Location:  Confusion. Quality:  Fair to poor. Severity:  Some bizarre behaviors. Timing:  Chronic back pain and medication management. Duration:  4 days. Context:  Psychosocial stresses and chronic back pain and medication management.  Past Medical History:  Past Medical History  Diagnosis Date  . Hypertension   . Diabetes mellitus without complication   . Hx of seasonal allergies   . Arthritis     gouty arthritis knees and hands    Past Surgical History  Procedure Laterality Date  . Pilonidal cyst excision      1957  . Appendectomy      1967  . Abdominal hysterectomy      1981  . Carpal tunnel release Left     1988  . Breast lumpectomy with needle localization Right 07/27/2013    Procedure: BREAST LUMPECTOMY WITH NEEDLE LOCALIZATION;  Surgeon: Edward Jolly, MD;  Location: [facility];  Service: General;  Laterality: Right;   Family History:  Family History  Problem Relation Age of Onset  . Colon cancer Sister   . Colon cancer Maternal Uncle   . Lung cancer Maternal Uncle   . Bone cancer Cousin    Social History:  History  Alcohol Use No     History  Drug Use No    Social History   Social History  .  Marital Status: Widowed    Spouse Name: N/A  . Number of Children: N/A  . Years of Education: N/A   Social History Main Topics  . Smoking status: Former Smoker -- 0.25 packs/day    Types: Cigarettes    Quit date: 08/28/1980  . Smokeless tobacco: None  . Alcohol Use: No  . Drug Use: No  . Sexual Activity: Not Currently   Other Topics Concern  . None   Social History Narrative   Additional Social History:                          Allergies:   Allergies   Allergen Reactions  . Demerol [Meperidine] Nausea Only    Labs:  Results for orders placed or performed during the hospital encounter of 07/15/15 (from the past 48 hour(s))  Urine rapid drug screen (hosp performed)     Status: None   Collection Time: 07/15/15 10:33 PM  Result Value Ref Range   Opiates NONE DETECTED NONE DETECTED   Cocaine NONE DETECTED NONE DETECTED   Benzodiazepines NONE DETECTED NONE DETECTED   Amphetamines NONE DETECTED NONE DETECTED   Tetrahydrocannabinol NONE DETECTED NONE DETECTED   Barbiturates NONE DETECTED NONE DETECTED    Comment:        DRUG SCREEN FOR MEDICAL PURPOSES ONLY.  IF CONFIRMATION IS NEEDED FOR ANY PURPOSE, NOTIFY LAB WITHIN 5 DAYS.        LOWEST DETECTABLE LIMITS FOR URINE DRUG SCREEN Drug Class       Cutoff (ng/mL) Amphetamine      1000 Barbiturate      200 Benzodiazepine   026 Tricyclics       378 Opiates          300 Cocaine          300 THC              50   Urinalysis, Routine w reflex microscopic (not at [facility])     Status: Abnormal   Collection Time: 07/15/15 10:33 PM  Result Value Ref Range   Color, Urine YELLOW YELLOW   APPearance CLEAR CLEAR   Specific Gravity, Urine 1.012 1.005 - 1.030   pH 6.0 5.0 - 8.0   Glucose, UA NEGATIVE NEGATIVE mg/dL   Hgb urine dipstick NEGATIVE NEGATIVE   Bilirubin Urine NEGATIVE NEGATIVE   Ketones, ur NEGATIVE NEGATIVE mg/dL   Protein, ur 30 (A) NEGATIVE mg/dL   Urobilinogen, UA 0.2 0.0 - 1.0 mg/dL   Nitrite NEGATIVE NEGATIVE   Leukocytes, UA SMALL (A) NEGATIVE  Urine microscopic-add on     Status: None   Collection Time: 07/15/15 10:33 PM  Result Value Ref Range   Squamous Epithelial / LPF RARE RARE   WBC, UA 0-2 <3 WBC/hpf  CBC with Differential/Platelet     Status: Abnormal   Collection Time: 07/15/15 11:52 PM  Result Value Ref Range   WBC 6.3 4.0 - 10.5 K/uL   RBC 3.45 (L) 3.87 - 5.11 MIL/uL   Hemoglobin 10.8 (L) 12.0 - 15.0 g/dL   HCT 31.8 (L) 36.0 - 46.0 %   MCV 92.2 78.0 -  100.0 fL   MCH 31.3 26.0 - 34.0 pg   MCHC 34.0 30.0 - 36.0 g/dL   RDW 13.1 11.5 - 15.5 %   Platelets 273 150 - 400 K/uL   Neutrophils Relative % 62 43 - 77 %   Neutro Abs 3.9 1.7 - 7.7 K/uL   Lymphocytes Relative  24 12 - 46 %   Lymphs Abs 1.5 0.7 - 4.0 K/uL   Monocytes Relative 13 (H) 3 - 12 %   Monocytes Absolute 0.8 0.1 - 1.0 K/uL   Eosinophils Relative 1 0 - 5 %   Eosinophils Absolute 0.1 0.0 - 0.7 K/uL   Basophils Relative 0 0 - 1 %   Basophils Absolute 0.0 0.0 - 0.1 K/uL  Comprehensive metabolic panel     Status: Abnormal   Collection Time: 07/15/15 11:52 PM  Result Value Ref Range   Sodium 134 (L) 135 - 145 mmol/L   Potassium 3.8 3.5 - 5.1 mmol/L   Chloride 100 (L) 101 - 111 mmol/L   CO2 26 22 - 32 mmol/L   Glucose, Bld 122 (H) 65 - 99 mg/dL   BUN 16 6 - 20 mg/dL   Creatinine, Ser 0.65 0.44 - 1.00 mg/dL   Calcium 9.9 8.9 - 10.3 mg/dL   Total Protein 6.6 6.5 - 8.1 g/dL   Albumin 3.8 3.5 - 5.0 g/dL   AST 25 15 - 41 U/L   ALT 27 14 - 54 U/L   Alkaline Phosphatase 45 38 - 126 U/L   Total Bilirubin 0.6 0.3 - 1.2 mg/dL   GFR calc non Af Amer >60 >60 mL/min   GFR calc Af Amer >60 >60 mL/min    Comment: (NOTE) The eGFR has been calculated using the CKD EPI equation. This calculation has not been validated in all clinical situations. eGFR's persistently <60 mL/min signify possible Chronic Kidney Disease.    Anion gap 8 5 - 15  Ammonia     Status: Abnormal   Collection Time: 07/15/15 11:52 PM  Result Value Ref Range   Ammonia 45 (H) 9 - 35 umol/L  I-Stat CG4 Lactic Acid, ED     Status: None   Collection Time: 07/16/15 12:14 AM  Result Value Ref Range   Lactic Acid, Venous 0.62 0.5 - 2.0 mmol/L  Comprehensive metabolic panel     Status: None   Collection Time: 07/16/15  5:00 AM  Result Value Ref Range   Sodium 135 135 - 145 mmol/L   Potassium 3.9 3.5 - 5.1 mmol/L   Chloride 101 101 - 111 mmol/L   CO2 26 22 - 32 mmol/L   Glucose, Bld 96 65 - 99 mg/dL   BUN 14 6 - 20  mg/dL   Creatinine, Ser 0.62 0.44 - 1.00 mg/dL   Calcium 10.1 8.9 - 10.3 mg/dL   Total Protein 6.6 6.5 - 8.1 g/dL   Albumin 3.9 3.5 - 5.0 g/dL   AST 20 15 - 41 U/L   ALT 24 14 - 54 U/L   Alkaline Phosphatase 44 38 - 126 U/L   Total Bilirubin 0.8 0.3 - 1.2 mg/dL   GFR calc non Af Amer >60 >60 mL/min   GFR calc Af Amer >60 >60 mL/min    Comment: (NOTE) The eGFR has been calculated using the CKD EPI equation. This calculation has not been validated in all clinical situations. eGFR's persistently <60 mL/min signify possible Chronic Kidney Disease.    Anion gap 8 5 - 15  CBC     Status: Abnormal   Collection Time: 07/16/15  5:00 AM  Result Value Ref Range   WBC 5.4 4.0 - 10.5 K/uL   RBC 3.36 (L) 3.87 - 5.11 MIL/uL   Hemoglobin 10.5 (L) 12.0 - 15.0 g/dL   HCT 31.1 (L) 36.0 - 46.0 %   MCV 92.6 78.0 -  100.0 fL   MCH 31.3 26.0 - 34.0 pg   MCHC 33.8 30.0 - 36.0 g/dL   RDW 13.1 11.5 - 15.5 %   Platelets 275 150 - 400 K/uL  Vitamin B12     Status: None   Collection Time: 07/16/15  5:25 AM  Result Value Ref Range   Vitamin B-12 656 180 - 914 pg/mL    Comment: (NOTE) This assay is not validated for testing neonatal or myeloproliferative syndrome specimens for Vitamin B12 levels. Performed at [facility]   Folate RBC     Status: Abnormal   Collection Time: 07/16/15  5:25 AM  Result Value Ref Range   Folate, Hemolysate 561.0 Not Estab. ng/mL   Hematocrit 31.3 (L) 34.0 - 46.6 %   Folate, RBC 1792 >498 ng/mL    Comment: (NOTE) Performed At: [facility] [place], [place] [id number] Lindon Romp MD [id number]   Iron and TIBC     Status: None   Collection Time: 07/16/15  5:25 AM  Result Value Ref Range   Iron 52 28 - 170 ug/dL   TIBC 304 250 - 450 ug/dL   Saturation Ratios 17 10.4 - 31.8 %   UIBC 252 ug/dL    Comment: Performed at [facility]  Ferritin     Status: None   Collection Time: 07/16/15  5:25 AM  Result Value Ref Range    Ferritin 203 11 - 307 ng/mL    Comment: Performed at [facility]  RPR     Status: None   Collection Time: 07/16/15  5:25 AM  Result Value Ref Range   RPR Ser Ql Non Reactive Non Reactive    Comment: (NOTE) Performed At: [facility] [place], [place] [id number] Lindon Romp MD [id number]   TSH     Status: None   Collection Time: 07/16/15  5:25 AM  Result Value Ref Range   TSH 0.827 0.350 - 4.500 uIU/mL  Hemoglobin A1c     Status: Abnormal   Collection Time: 07/16/15  5:25 AM  Result Value Ref Range   Hgb A1c MFr Bld 6.1 (H) 4.8 - 5.6 %    Comment: (NOTE)         Pre-diabetes: 5.7 - 6.4         Diabetes: >6.4         Glycemic control for adults with diabetes: <7.0    Mean Plasma Glucose 128 mg/dL    Comment: (NOTE) Performed At: [facility] [place], [place] [id number] Lindon Romp MD [id number]   Glucose, capillary     Status: Abnormal   Collection Time: 07/16/15  7:49 AM  Result Value Ref Range   Glucose-Capillary 108 (H) 65 - 99 mg/dL  Occult blood card to lab, stool     Status: None   Collection Time: 07/16/15 10:36 AM  Result Value Ref Range   Fecal Occult Bld NEGATIVE NEGATIVE  Glucose, capillary     Status: Abnormal   Collection Time: 07/16/15 11:44 AM  Result Value Ref Range   Glucose-Capillary 105 (H) 65 - 99 mg/dL  Glucose, capillary     Status: Abnormal   Collection Time: 07/16/15  4:47 PM  Result Value Ref Range   Glucose-Capillary 100 (H) 65 - 99 mg/dL  Glucose, capillary     Status: None   Collection Time: 07/17/15 12:40 AM  Result Value Ref Range   Glucose-Capillary 98 65 - 99  mg/dL  Basic metabolic panel     Status: Abnormal   Collection Time: 07/17/15  5:24 AM  Result Value Ref Range   Sodium 136 135 - 145 mmol/L   Potassium 4.0 3.5 - 5.1 mmol/L   Chloride 104 101 - 111 mmol/L   CO2 26 22 - 32 mmol/L   Glucose, Bld 106 (H) 65 - 99 mg/dL   BUN 15 6 - 20 mg/dL   Creatinine, Ser  0.70 0.44 - 1.00 mg/dL   Calcium 10.1 8.9 - 10.3 mg/dL   GFR calc non Af Amer >60 >60 mL/min   GFR calc Af Amer >60 >60 mL/min    Comment: (NOTE) The eGFR has been calculated using the CKD EPI equation. This calculation has not been validated in all clinical situations. eGFR's persistently <60 mL/min signify possible Chronic Kidney Disease.    Anion gap 6 5 - 15  CBC     Status: Abnormal   Collection Time: 07/17/15  5:24 AM  Result Value Ref Range   WBC 4.8 4.0 - 10.5 K/uL   RBC 3.28 (L) 3.87 - 5.11 MIL/uL   Hemoglobin 10.1 (L) 12.0 - 15.0 g/dL   HCT 30.5 (L) 36.0 - 46.0 %   MCV 93.0 78.0 - 100.0 fL   MCH 30.8 26.0 - 34.0 pg   MCHC 33.1 30.0 - 36.0 g/dL   RDW 13.0 11.5 - 15.5 %   Platelets 252 150 - 400 K/uL    Vitals: Blood pressure 131/44, pulse 90, temperature 97.9 F (36.6 C), temperature source Oral, resp. rate 20, height _0  (1.448 m), weight 62.5 kg (137 lb 12.6 oz), SpO2 97 %.  Risk to Self: Is patient at risk for suicide?: No Risk to Others:   Prior Inpatient Therapy:   Prior Outpatient Therapy:    Current Facility-Administered Medications  Medication Dose Route Frequency Provider Last Rate Last Dose  . 0.9 %  sodium chloride infusion   Intravenous Continuous Allyne Gee, MD 50 mL/hr at 07/16/15 2057    . acetaminophen (TYLENOL) tablet 650 mg  650 mg Oral Q6H PRN Allyne Gee, MD       Or  . acetaminophen (TYLENOL) suppository 650 mg  650 mg Rectal Q6H PRN Allyne Gee, MD      . allopurinol (ZYLOPRIM) tablet 300 mg  300 mg Oral Daily Nita Sells, MD   300 mg at 07/17/15 1009  . colchicine tablet 0.6 mg  0.6 mg Oral Daily Nita Sells, MD   0.6 mg at 07/17/15 1009  . folic acid (FOLVITE) tablet 1 mg  1 mg Oral Daily Allyne Gee, MD   1 mg at 07/17/15 1009  . heparin injection 5,000 Units  5,000 Units Subcutaneous 3 times per day Allyne Gee, MD   5,000 Units at 07/17/15 1416  . losartan (COZAAR) tablet 50 mg  50 mg Oral Daily Allyne Gee,  MD   50 mg at 07/17/15 1009   And  . hydrochlorothiazide (MICROZIDE) capsule 12.5 mg  12.5 mg Oral Daily Allyne Gee, MD   12.5 mg at 07/17/15 1009  . insulin aspart (novoLOG) injection 0-15 Units  0-15 Units Subcutaneous TID WC Allyne Gee, MD   0 Units at 07/16/15 0830  . metFORMIN (GLUCOPHAGE) tablet 250 mg  250 mg Oral Q breakfast Allyne Gee, MD   250 mg at 07/17/15 0831  . multivitamin with minerals tablet 1 tablet  1 tablet Oral Daily Allyne Gee,  MD   1 tablet at 07/17/15 1009  . sodium chloride 0.9 % injection 3 mL  3 mL Intravenous Q12H Allyne Gee, MD   3 mL at 07/16/15 0200  . thiamine (VITAMIN B-1) tablet 100 mg  100 mg Oral Daily Allyne Gee, MD   100 mg at 07/17/15 1009    Musculoskeletal: Strength & Muscle Tone: decreased Gait & Station: unable to stand Patient leans: N/A  Psychiatric Specialty Exam: Physical Exam as per history and physical  ROS back pain and confusion, poor orientation. denied nausea, vomiting, chest pain and shortness of breath No Fever-chills, No Headache, No changes with Vision or hearing, reports vertigo No problems swallowing food or Liquids, No Chest pain, Cough or Shortness of Breath, No Abdominal pain, No Nausea or Vommitting, Bowel movements are regular, No Blood in stool or Urine, No dysuria, No new skin rashes or bruises, No new joints pains-aches,  No new weakness, tingling, numbness in any extremity, No recent weight gain or loss, No polyuria, polydypsia or polyphagia,   A full 10 point Review of Systems was done, except as stated above, all other Review of Systems were negative.  Blood pressure 131/44, pulse 90, temperature 97.9 F (36.6 C), temperature source Oral, resp. rate 20, height _0  (1.448 m), weight 62.5 kg (137 lb 12.6 oz), SpO2 97 %.Body mass index is 29.81 kg/(m^2).  General Appearance: Guarded  Eye Contact::  Fair  Speech:  Clear and Coherent  Volume:  Decreased  Mood:  Anxious  Affect:  Appropriate  and Congruent  Thought Process:  Coherent and Goal Directed  Orientation:  Other:  knows her name, name of the hospital but confused about year, month and date  Thought Content:  Rumination  Suicidal Thoughts:  No  Homicidal Thoughts:  No  Memory:  Immediate;   Fair Recent;   Poor  Judgement:  Impaired  Insight:  Shallow  Psychomotor Activity:  Decreased  Concentration:  Fair  Recall:  [place] of Knowledge:Good  Language: Good  Akathisia:  Negative  Handed:  Right  AIMS (if indicated):     Assets:  Communication Skills Desire for Improvement Financial Resources/Insurance Housing Intimacy Resilience Social Support Transportation  ADL's:  Impaired  Cognition: Impaired,  Mild  Sleep:      Medical Decision Making: New problem, with additional work up planned, Review of Psycho-Social Stressors (1), Discuss test with performing physician (1), Decision to obtain old records (1), Review of Last Therapy Session (1), Review or order medicine tests (1), Review of Medication Regimen & Side Effects (2) and Review of New Medication or Change in Dosage (2)  Treatment Plan Summary: Patient has a new onset of altered mental status, confusion, problems with orientation and concentration. Patient seems to be subacute delirium is probably secondary to her current medications like steroids and pain medication. Patient benefit from Seroquel 25 mg once or twice a day if she continued to be confused and not able to sleep  Daily contact with patient to assess and evaluate symptoms and progress in treatment and Medication management  Plan: Patient may benefit from the out-of-home placement for short period when medically stable May start Seroquel 25 mg tonight and reassess tomorrow.  Patient does not meet criteria for psychiatric inpatient admission. Supportive therapy provided about ongoing stressors.  Appreciate psychiatric consultation Please contact [phone] or [phone] if needs further  assistance   Disposition: Will refer to the outpatient medication management when medically stable.  Arlow Spiers,JANARDHAHA R.  07/17/2015 4:09 PM

## 2015-07-17 NOTE — Progress Notes (Signed)
Kelly Webster FFM:[id number] DOB: 1930-12-09 DOA: 07/15/2015 PCP: Donnajean Lopes, MD  Brief narrative:  77 yr ? stage IA ER PR positive invasive cancer of the right breast, status post right breast lumpectomy. She was treated with lumpectomy revealing a 1.3 cm invasive tumor             Recently seen in [facility] and sent home Was on Prednisone and has been on Oxycodone for LBP -Family states that 5-6 weeks prior to admission patient was having significant back pain and started on a bunch of doses of Solu-Medrol, oxycodone.  She was placed on these medications and recently as late as 07/07/15 tapered off of her steroids. Since then she has been confused and not completely herself She's been more fidgety and has had significant episodic repetitive movements and cannot tell the date the time the year Family states that this memory issue is chronic but this presentation is more subacute Over the past one half year the son relates that patient has been somewhat confused and missing things                           Consultants:  psychiatry  Procedures:    Antibiotics:  None    Subjective   Confused still thinks yr 2002, month aug date 14th Can tell me this is [facility] Can tell me daughters name and s-i-l- name Eating fair Fidgety No othe rc/o from patient Daughter states still very confused, no sleep last pm   Objective    Interim History:   Telemetry: sinus   Objective: Filed Vitals:   07/16/15 0521 07/16/15 1424 07/17/15 0530 07/17/15 1339  BP: 119/58 133/57 122/57 131/44  Pulse: 85 75 76 90  Temp: 98.2 F (36.8 C) 97.9 F (36.6 C) 97.7 F (36.5 C) 97.9 F (36.6 C)  TempSrc: Oral Oral Oral Oral  Resp: 16 16 16 20   Height:      Weight:      SpO2: 100% 98% 98% 97%    Intake/Output Summary (Last 24 hours) at 07/17/15 1439 Last data filed at 07/17/15 1340  Gross per 24 hour  Intake   2040 ml  Output    550 ml  Net   1490 ml    Exam:  EOMI ncat Till a little  confused  tno m No ict/pallor cta b abd soft Nt NNd   Data Reviewed: Basic Metabolic Panel:  Recent Labs Lab 07/14/15 1631 07/15/15 2352 07/16/15 0500 07/17/15 0524  NA 130* 134* 135 136  K 3.7 3.8 3.9 4.0  CL 95* 100* 101 104  CO2 27 26 26 26   GLUCOSE 111* 122* 96 106*  BUN 19 16 14 15   CREATININE 0.66 0.65 0.62 0.70  CALCIUM 10.3 9.9 10.1 10.1   Liver Function Tests:  Recent Labs Lab 07/14/15 1631 07/15/15 2352 07/16/15 0500  AST 28 25 20   ALT 26 27 24   ALKPHOS 46 45 44  BILITOT 0.8 0.6 0.8  PROT 7.3 6.6 6.6  ALBUMIN 4.0 3.8 3.9   No results for input(s): LIPASE, AMYLASE in the last 168 hours.  Recent Labs Lab 07/15/15 2352  AMMONIA 45*   CBC:  Recent Labs Lab 07/14/15 1631 07/15/15 2352 07/16/15 0500 07/17/15 0524  WBC 6.6 6.3 5.4 4.8  NEUTROABS 3.9 3.9  --   --   HGB 11.1* 10.8* 10.5* 10.1*  HCT 31.5* 31.8* 31.1* 30.5*  MCV 91.8 92.2 92.6 93.0  PLT 290  273 275 252   Cardiac Enzymes:  Recent Labs Lab 07/14/15 1631  TROPONINI <0.03   BNP: Invalid input(s): POCBNP CBG:  Recent Labs Lab 07/16/15 0749 07/16/15 1144 07/16/15 1647 07/17/15 0040  GLUCAP 108* 105* 100* 98    Recent Results (from the past 240 hour(s))  Urine culture     Status: None   Collection Time: 07/14/15  4:17 PM  Result Value Ref Range Status   Specimen Description URINE, CLEAN CATCH  Final   Special Requests NONE  Final   Culture   Final    MULTIPLE SPECIES PRESENT, SUGGEST RECOLLECTION Performed at [facility]    Report Status 07/16/2015 FINAL  Final     Studies:              All Imaging reviewed and is as per above notation   Scheduled Meds: . allopurinol  300 mg Oral Daily  . colchicine  0.6 mg Oral Daily  . folic acid  1 mg Oral Daily  . heparin  5,000 Units Subcutaneous 3 times per day  . losartan  50 mg Oral Daily   And  . hydrochlorothiazide  12.5 mg Oral Daily  . insulin aspart  0-15 Units Subcutaneous TID WC  . metFORMIN  250  mg Oral Q breakfast  . multivitamin with minerals  1 tablet Oral Daily  . sodium chloride  3 mL Intravenous Q12H  . thiamine  100 mg Oral Daily   Continuous Infusions: . sodium chloride 50 mL/hr at 07/16/15 2057     Assessment/Plan:  Toxic metabolic encephalopathy with superimposed delirum from meds -limit all pain meds/prednisone -no more oral pain meds for LBP, so consider patches or Capsaicin -continue IV saline for now -have asked psychaitry for opinion, Appreciate Dr. Lenna Sciara opinion  Br Cancer s/p lumpectomy -stable  Htn Continue HCTZ 12.5 qd Cont Losartan 50 qd  DM ty II Continue Metfromin 250 q am  Muild normocytic anemia -stable   Appt with PCP: Requested Code Status: Full code Family Communication: discussed with daughter andfamily + Disposition Plan: liekly snf DVT prophylaxis: SCD Consultants:   Verneita Griffes, MD  [facility] Pager [phone] 07/17/2015, 2:39 PM

## 2015-07-17 NOTE — Clinical Social Work Psych Assess (Signed)
Clinical Social Work Nature conservation officer  Clinical Social Worker:  Boone Master, [place] Date/Time:  07/17/2015, 4:26 PM Referred By:  Physician Date Referred:  07/17/15 Reason for Referral:  Competency/Guardianship   Presenting Symptoms/Problems  Presenting Symptoms/Problems(in person's/family's own words):  Psych consulted for capacity evaluation.   Abuse/Neglect/Trauma History  Abuse/Neglect/Trauma History:  Denies History Abuse/Neglect/Trauma History Comments (indicate dates):  N/A   Psychiatric History  Psychiatric History:  Denies History Psychiatric Medication:  None currently   Current Mental Health Hospitalizations/Previous Mental Health History:  No previous MH diagnosis or treatment.   Current Provider:  None currently Place and Date:  N/A  Current Medications:   Scheduled Meds: . allopurinol  300 mg Oral Daily  . colchicine  0.6 mg Oral Daily  . folic acid  1 mg Oral Daily  . heparin  5,000 Units Subcutaneous 3 times per day  . losartan  50 mg Oral Daily   And  . hydrochlorothiazide  12.5 mg Oral Daily  . insulin aspart  0-15 Units Subcutaneous TID WC  . metFORMIN  250 mg Oral Q breakfast  . multivitamin with minerals  1 tablet Oral Daily  . sodium chloride  3 mL Intravenous Q12H  . thiamine  100 mg Oral Daily   Continuous Infusions: . sodium chloride 50 mL/hr at 07/16/15 2057   PRN Meds:.acetaminophen **OR** acetaminophen     Previous Inpatient Admission/Date/Reason:  None reported   Emotional Health/Current Symptoms  Suicide/Self Harm: None Reported Suicide Attempt in Past (date/description):  No SI or HI.  Other Harmful Behavior (ex. homicidal ideation) (describe):  None reported   Psychotic/Dissociative Symptoms  Psychotic/Dissociative Symptoms: None Reported Other Psychotic/Dissociative Symptoms:  N/A   Attention/Behavioral Symptoms  Attention/Behavioral Symptoms: Restless Other Attention/Behavioral Symptoms:   Patient moved objects around bed and on tray throughout assessment.   Cognitive Impairment  Cognitive Impairment:  Orientation - Self, Orientation - Place Other Cognitive Impairment:  Patient confused at times.    Mood and Adjustment  Mood and Adjustment:  Mood Congruent   Stress, Anxiety, Trauma, Any Recent Loss/Stressor  Stress, Anxiety, Trauma, Any Recent Loss/Stressor: None Reported Anxiety (frequency):  N/A  Phobia (specify):  N/A  Compulsive Behavior (specify):  N/A  Obsessive Behavior (specify):  N/A  Other Stress, Anxiety, Trauma, Any Recent Loss/Stressor:  N/A   Substance Abuse/Use  Substance Abuse/Use: None SBIRT Completed (please refer for detailed history): No Self-reported Substance Use (last use and frequency):  None reported  Urinary Drug Screen Completed: Yes Alcohol Level:  <5   Environment/Housing/Living Arrangement  Environmental/Housing/Living Arrangement: Stable Housing Who is in the Home:  Alone  Emergency Contact:  Don-son   Financial  Financial: Medicare   Patient's Strengths and Goals  Patient's Strengths and Goals (patient's own words):  Patient has supportive family   Clinical Social Worker's Interpretive Summary  Clinical Social Workers Interpretive Summary:    CSW and psych MD evaluated patient together. Patient in bed with dtr and son-in-law present.  Patient lives home alone and family assists as needed. Patient hurt her back in July 2016 and has been following up with her doctor for treatment. Patient's family noticed a change in behavior after taking pain medication and steroids. Patient was telling family that she needed to call her credit card and her card would tell her what to do. Patient also drove to the store and accused the cashier of stealing her card. Family reports no previous MH diagnosis or treatment.  Patient completed mini-mental status exam (MMSE) but  remains confused. Family believes that confusion is  improving but that patient is not back to her baseline. Family interested in possible placement at DC. Unit CSW to assist with SNF placement if needed.  CSW will continue to follow.   Disposition  Disposition: Recommend Psych CSW Continuing To Support While In [facility], [place]

## 2015-07-18 LAB — GLUCOSE, CAPILLARY
GLUCOSE-CAPILLARY: 101 mg/dL — AB (ref 65–99)
Glucose-Capillary: 94 mg/dL (ref 65–99)
Glucose-Capillary: 102 mg/dL — ABNORMAL HIGH (ref 65–99)

## 2015-07-18 NOTE — Consult Note (Signed)
   [facility] CM Inpatient Consult   07/18/2015  Kelly Webster Nov 15, 1931 [id number]   Referral received from inpatient [facility] for [place] Management. Came to bedside to speak with patient. Instructed to speak with son, Kelly Webster about [facility] services. Spoke with Kelly Webster to explain [facility] Care Management. Kelly Webster states he is a little upset right now because he wants his mother to go to rehab. He states he will look over the [facility] Management brochure and call writer if he decides upon services. [facility] Care Management brochure and contact information given to patient's son. Inpatient RNCM aware.  Marthenia Rolling, MSN-Ed, RN,BSN [facility] Liaison [phone]

## 2015-07-18 NOTE — Progress Notes (Signed)
Clinical Social Work  Psych MD and CSW rounded on patient. Patient and son Linna Hoff) at bedside. Son reports patient is improving but still has periods of confusion. Patient continues to move objects on tray and son reports that patient will not eat unless directed to do so. Son reports family is unable to provide 24 hour care and they are upset that patient's insurance will not cover SNF placement. Patient able to participate in assessment and reports that her confusion is improving. Patient did not sleep well last night and had trouble staying asleep. Psych MD to make medication recommendations.  [place], [place] [phone]

## 2015-07-18 NOTE — Care Management Note (Signed)
Case Management Note  Patient Details  Name: Kelly Webster MRN: [id number] Date of Birth: 1931-02-28  Subjective/Objective:  81 y/o f admitted w/Delirium. From home alone.Spoke to son @ length about HHC(Skilled intermittent services),private duty care(Independant custodial level care services-out of pocket expense). He voiced understanding.He will discuss options w/other family members. CSW also informed son about SNF services,also answered ques of psych-recommendations of otpt med mgmnt.Await HHC choice.Will need HHC roders, if MD agree.would recommend [place], [place] Nurse's aide, Education officer, museum.Will also have [facility] to assess.                  Action/Plan:d/c plan home.   Expected Discharge Date:                  Expected Discharge Plan:  [place]  In-House Referral:     Discharge planning Services  CM Consult  Post Acute Care Choice:    Choice offered to:     DME Arranged:    DME Agency:     HH Arranged:    [place] Agency:     Status of Service:  In process, will continue to follow  Medicare Important Message Given:    Date Medicare IM Given:    Medicare IM give by:    Date Additional Medicare IM Given:    Additional Medicare Important Message give by:     If discussed at [place] of Stay Meetings, dates discussed:    Additional Comments:  Dessa Phi, RN 07/18/2015, 1:14 PM

## 2015-07-18 NOTE — Evaluation (Signed)
Physical Therapy Evaluation Patient Details Name: Kelly Webster MRN: [id number] DOB: 08/14/1931 Today's Date: 07/18/2015   History of Present Illness  81 y.o. female with a past history of DM, HTN, invasive cancer of the right breast, status post right breast lumpectomy and presents with altered mental status. She had been on prednisone for the last week and also had been on oxycodone for back pain.  Pt admitted for toxic metabolic encephalopathy with superimposed delirum from meds  Clinical Impression  Pt admitted with above diagnosis. Pt currently with functional limitations due to the deficits listed below (see PT Problem List).   Pt will benefit from skilled PT to increase their independence and safety with mobility to allow discharge to the venue listed below.   Pt mobilizing well however requires cues to redirect and for safety.  Do not feel f/u PT is indicated at this time however recommend supervision upon d/c for safety due to cognitive deficits.     Follow Up Recommendations No PT follow up;Supervision for mobility/OOB    Equipment Recommendations  None recommended by PT    Recommendations for Other Services       Precautions / Restrictions Precautions Precautions: Fall      Mobility  Bed Mobility               General bed mobility comments: pt up in recliner on arrival  Transfers Overall transfer level: Needs assistance Equipment used: Rolling walker (2 wheeled) Transfers: Sit to/from Stand Sit to Stand: Min guard;Supervision         General transfer comment: verbal cues for safety  Ambulation/Gait Ambulation/Gait assistance: Min guard;Supervision Ambulation Distance (Feet): 280 Feet Assistive device: None Gait Pattern/deviations: Step-through pattern;Decreased stride length     General Gait Details: pushed IV pole, slow but steady pace, able to look around environment, start/stop and walk backwards 10 feet without difficulty and no LOB  Stairs             Wheelchair Mobility    Modified Rankin (Stroke Patients Only)       Balance Overall balance assessment: Needs assistance         Standing balance support: No upper extremity supported Standing balance-Leahy Scale: Good               High level balance activites: Sudden stops;Head turns;Backward walking High Level Balance Comments: during ambulation performed above balance activities without assist or LOB             Pertinent Vitals/Pain Pain Assessment: No/denies pain    Home Living Family/patient expects to be discharged to:: Private residence Living Arrangements: Alone Available Help at Discharge: Family;Available PRN/intermittently Type of Home: House       Home Layout: One level Home Equipment: Cane - single point      Prior Function Level of Independence: Independent with assistive device(s)         Comments: occasionally uses SPC, likely also furniture walker     Hand Dominance        Extremity/Trunk Assessment   Upper Extremity Assessment: Overall WFL for tasks assessed           Lower Extremity Assessment: Overall WFL for tasks assessed      Cervical / Trunk Assessment: Kyphotic  Communication   Communication: No difficulties  Cognition Arousal/Alertness: Awake/alert Behavior During Therapy: WFL for tasks assessed/performed Overall Cognitive Status: Impaired/Different from baseline Area of Impairment: Memory  General Comments: per family pt with transient cognitive issues, pt appropriate during session however tends to perseverate and restless with hand activity (wears gloves, has lap buddy)    General Comments      Exercises        Assessment/Plan    PT Assessment Patient needs continued PT services  PT Diagnosis Difficulty walking   PT Problem List Decreased strength;Decreased mobility;Decreased safety awareness  PT Treatment Interventions DME instruction;Gait  training;Functional mobility training;Patient/family education;Therapeutic activities;Therapeutic exercise   PT Goals (Current goals can be found in the Care Plan section) Acute Rehab PT Goals PT Goal Formulation: With patient/family Time For Goal Achievement: 07/25/15 Potential to Achieve Goals: Good    Frequency Min 2X/week   Barriers to discharge        Co-evaluation               End of Session   Activity Tolerance: Patient tolerated treatment well Patient left: in chair;with call bell/phone within reach;with chair alarm set;with family/visitor present           Time: 7014-1030 PT Time Calculation (min) (ACUTE ONLY): 21 min   Charges:   PT Evaluation $Initial PT Evaluation Tier I: 1 Procedure     PT G Codes:        David Towson,KATHrine E 07/18/2015, 1:34 PM Carmelia Bake, PT, DPT 07/18/2015 Pager: [phone]

## 2015-07-18 NOTE — Consult Note (Signed)
[facility] Consult follow-up  Reason for Consult:  confusion and disorientation Referring Physician:  Dr. Izora Gala Patient Identification: Kelly Webster MRN:  [id number] Principal Diagnosis: Delirium Diagnosis:   Patient Active Problem List   Diagnosis Date Noted  . HTN (hypertension) [I10] 07/16/2015  . Diabetes mellitus without complication [T15.7] 26/20/3559  . Altered mental status [R41.82] 07/16/2015  . Altered mental state [R41.82] 07/16/2015  . Cognitive and behavioral changes [R41.89, F91.9]   . Delirium [R41.0]   . Breast cancer of upper-outer quadrant of right female breast [C50.411] 06/22/2013    Total Time spent with patient: 30 minutes  Subjective:   Kelly Webster is a 81 y.o. female patient admitted with recent onset of confusion and disorientation.  HPI:  Kelly Webster is a 81 y.o. 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 Patient reportedly has been confused and disoriented for the last 4 days. Patient reported she has been suffering with back pain over 4 months and her pain medication is not controlling the they want to do further testing in the hospital. Meanwhile patient was treated with Steroids and pain medication. Patient reportedly living by herself and able to manage her personal effects and financial budget until a few days ago.  Patient has few bizarre behaviors and also has some magical thinking as per her daughter and son-in-law who is at bedside. Because the nature of these symptoms which are short-term, may be delirium secondary to medication management at this time. Patient will be reevaluated further to see an improvement before she was discharged a the home or out of the home placement. Case discussed with psychiatric clinical social service and patient family. Patient has poor responses for questions related to current orientation. She is confused about a year, month, date and  name of the floor and could not subtract 7 series but able to calm down members from 10-1 and also names of the week back and forth. Patient has a good long-term memory, short-term memory fine immediately but 1 out of 3 delayed. Patient has fine language functions.  HPI Elements:   Location:  Confusion. Quality:  Fair to poor. Severity:  Some bizarre behaviors. Timing:  Chronic back pain and medication management. Duration:  4 days. Context:  Psychosocial stresses and chronic back pain and medication management. interval history:   Interval history: Patient seen today for psychiatric consultation follow-up and case discussed with the patient's son who is at bedside and Dr. Verlon Au and psychiatric social service. Patient has ongoing confusion but improved at orientation. Patient appeared sitting in a chair next to her bed and constantly anxious and confused. Reportedly patient did not sleep well last night and we will increase her medication for better control of her insomnia which may reduce her confusion. Patient has no agitation or aggressive behaviors. Patient family is concerned about her aftercare/disposition plans.   Past Medical History:  Past Medical History  Diagnosis Date  . Hypertension   . Diabetes mellitus without complication   . Hx of seasonal allergies   . Arthritis     gouty arthritis knees and hands    Past Surgical History  Procedure Laterality Date  . Pilonidal cyst excision      1957  . Appendectomy      1967  . Abdominal hysterectomy      1981  . Carpal tunnel release Left     1988  . Breast lumpectomy with needle localization Right 07/27/2013  Procedure: BREAST LUMPECTOMY WITH NEEDLE LOCALIZATION;  Surgeon: Edward Jolly, MD;  Location: [facility];  Service: General;  Laterality: Right;   Family History:  Family History  Problem Relation Age of Onset  . Colon cancer Sister   . Colon cancer Maternal Uncle   . Lung cancer Maternal  Uncle   . Bone cancer Cousin    Social History:  History  Alcohol Use No     History  Drug Use No    Social History   Social History  . Marital Status: Widowed    Spouse Name: N/A  . Number of Children: N/A  . Years of Education: N/A   Social History Main Topics  . Smoking status: Former Smoker -- 0.25 packs/day    Types: Cigarettes    Quit date: 08/28/1980  . Smokeless tobacco: None  . Alcohol Use: No  . Drug Use: No  . Sexual Activity: Not Currently   Other Topics Concern  . None   Social History Narrative   Additional Social History:                          Allergies:   Allergies  Allergen Reactions  . Demerol [Meperidine] Nausea Only    Labs:  Results for orders placed or performed during the hospital encounter of 07/15/15 (from the past 48 hour(s))  Glucose, capillary     Status: Abnormal   Collection Time: 07/16/15  4:47 PM  Result Value Ref Range   Glucose-Capillary 100 (H) 65 - 99 mg/dL  Glucose, capillary     Status: None   Collection Time: 07/17/15 12:40 AM  Result Value Ref Range   Glucose-Capillary 98 65 - 99 mg/dL  Basic metabolic panel     Status: Abnormal   Collection Time: 07/17/15  5:24 AM  Result Value Ref Range   Sodium 136 135 - 145 mmol/L   Potassium 4.0 3.5 - 5.1 mmol/L   Chloride 104 101 - 111 mmol/L   CO2 26 22 - 32 mmol/L   Glucose, Bld 106 (H) 65 - 99 mg/dL   BUN 15 6 - 20 mg/dL   Creatinine, Ser 0.70 0.44 - 1.00 mg/dL   Calcium 10.1 8.9 - 10.3 mg/dL   GFR calc non Af Amer >60 >60 mL/min   GFR calc Af Amer >60 >60 mL/min    Comment: (NOTE) The eGFR has been calculated using the CKD EPI equation. This calculation has not been validated in all clinical situations. eGFR's persistently <60 mL/min signify possible Chronic Kidney Disease.    Anion gap 6 5 - 15  CBC     Status: Abnormal   Collection Time: 07/17/15  5:24 AM  Result Value Ref Range   WBC 4.8 4.0 - 10.5 K/uL   RBC 3.28 (L) 3.87 - 5.11 MIL/uL    Hemoglobin 10.1 (L) 12.0 - 15.0 g/dL   HCT 30.5 (L) 36.0 - 46.0 %   MCV 93.0 78.0 - 100.0 fL   MCH 30.8 26.0 - 34.0 pg   MCHC 33.1 30.0 - 36.0 g/dL   RDW 13.0 11.5 - 15.5 %   Platelets 252 150 - 400 K/uL  Glucose, capillary     Status: None   Collection Time: 07/17/15  7:29 AM  Result Value Ref Range   Glucose-Capillary 94 65 - 99 mg/dL   Comment 1 Notify RN    Comment 2 Document in Chart   Glucose, capillary  Status: Abnormal   Collection Time: 07/17/15 12:08 PM  Result Value Ref Range   Glucose-Capillary 110 (H) 65 - 99 mg/dL   Comment 1 Notify RN    Comment 2 Document in Chart   Glucose, capillary     Status: Abnormal   Collection Time: 07/17/15  5:05 PM  Result Value Ref Range   Glucose-Capillary 111 (H) 65 - 99 mg/dL  Glucose, capillary     Status: Abnormal   Collection Time: 07/17/15 10:47 PM  Result Value Ref Range   Glucose-Capillary 106 (H) 65 - 99 mg/dL  Glucose, capillary     Status: None   Collection Time: 07/18/15  7:37 AM  Result Value Ref Range   Glucose-Capillary 94 65 - 99 mg/dL  Glucose, capillary     Status: Abnormal   Collection Time: 07/18/15 11:54 AM  Result Value Ref Range   Glucose-Capillary 102 (H) 65 - 99 mg/dL  Glucose, capillary     Status: Abnormal   Collection Time: 07/18/15  3:55 PM  Result Value Ref Range   Glucose-Capillary 101 (H) 65 - 99 mg/dL    Vitals: Blood pressure 123/59, pulse 85, temperature 98.2 F (36.8 C), temperature source Oral, resp. rate 18, height _0  (1.448 m), weight 62.5 kg (137 lb 12.6 oz), SpO2 97 %.  Risk to Self: Is patient at risk for suicide?: No Risk to Others:   Prior Inpatient Therapy:   Prior Outpatient Therapy:    Current Facility-Administered Medications  Medication Dose Route Frequency Provider Last Rate Last Dose  . acetaminophen (TYLENOL) tablet 650 mg  650 mg Oral Q6H PRN Allyne Gee, MD       Or  . acetaminophen (TYLENOL) suppository 650 mg  650 mg Rectal Q6H PRN Allyne Gee, MD      .  allopurinol (ZYLOPRIM) tablet 300 mg  300 mg Oral Daily Nita Sells, MD   300 mg at 07/18/15 0951  . colchicine tablet 0.6 mg  0.6 mg Oral Daily Nita Sells, MD   0.6 mg at 07/18/15 0951  . folic acid (FOLVITE) tablet 1 mg  1 mg Oral Daily Allyne Gee, MD   1 mg at 07/18/15 0951  . heparin injection 5,000 Units  5,000 Units Subcutaneous 3 times per day Allyne Gee, MD   5,000 Units at 07/18/15 1430  . losartan (COZAAR) tablet 50 mg  50 mg Oral Daily Allyne Gee, MD   50 mg at 07/18/15 9371   And  . hydrochlorothiazide (MICROZIDE) capsule 12.5 mg  12.5 mg Oral Daily Allyne Gee, MD   12.5 mg at 07/18/15 0951  . insulin aspart (novoLOG) injection 0-15 Units  0-15 Units Subcutaneous TID WC Allyne Gee, MD   0 Units at 07/16/15 0830  . metFORMIN (GLUCOPHAGE) tablet 250 mg  250 mg Oral Q breakfast Allyne Gee, MD   250 mg at 07/18/15 0756  . multivitamin with minerals tablet 1 tablet  1 tablet Oral Daily Allyne Gee, MD   1 tablet at 07/18/15 0951  . QUEtiapine (SEROQUEL) tablet 25 mg  25 mg Oral QHS Ambrose Finland, MD   25 mg at 07/17/15 2251  . sodium chloride 0.9 % injection 3 mL  3 mL Intravenous Q12H Allyne Gee, MD   3 mL at 07/16/15 0200  . thiamine (VITAMIN B-1) tablet 100 mg  100 mg Oral Daily Allyne Gee, MD   100 mg at 07/18/15 6967    Musculoskeletal:  Strength & Muscle Tone: decreased Gait & Station: unable to stand Patient leans: N/A  Psychiatric Specialty Exam: Physical Exam as per history and physical  ROS back pain and confusion, poor orientation. denied nausea, vomiting, chest pain and shortness of breath No Fever-chills, No Headache, No changes with Vision or hearing, reports vertigo No problems swallowing food or Liquids, No Chest pain, Cough or Shortness of Breath, No Abdominal pain, No Nausea or Vommitting, Bowel movements are regular, No Blood in stool or Urine, No dysuria, No new skin rashes or bruises, No new joints  pains-aches,  No new weakness, tingling, numbness in any extremity, No recent weight gain or loss, No polyuria, polydypsia or polyphagia,   A full 10 point Review of Systems was done, except as stated above, all other Review of Systems were negative.  Blood pressure 123/59, pulse 85, temperature 98.2 F (36.8 C), temperature source Oral, resp. rate 18, height _0  (1.448 m), weight 62.5 kg (137 lb 12.6 oz), SpO2 97 %.Body mass index is 29.81 kg/(m^2).  General Appearance: Guarded  Eye Contact::  Fair  Speech:  Clear and Coherent  Volume:  Decreased  Mood:  Anxious  Affect:  Appropriate and Congruent  Thought Process:  Coherent and Goal Directed  Orientation:  Other:  knows her name, name of the hospital but confused about year, month and date  Thought Content:  Rumination  Suicidal Thoughts:  No  Homicidal Thoughts:  No  Memory:  Immediate;   Fair Recent;   Poor  Judgement:  Impaired  Insight:  Shallow  Psychomotor Activity:  Decreased  Concentration:  Fair  Recall:  [place] of Knowledge:Good  Language: Good  Akathisia:  Negative  Handed:  Right  AIMS (if indicated):     Assets:  Communication Skills Desire for Improvement Financial Resources/Insurance Housing Intimacy Resilience Social Support Transportation  ADL's:  Impaired  Cognition: Impaired,  Mild  Sleep:      Medical Decision Making: New problem, with additional work up planned, Review of Psycho-Social Stressors (1), Discuss test with performing physician (1), Decision to obtain old records (1), Review of Last Therapy Session (1), Review or order medicine tests (1), Review of Medication Regimen & Side Effects (2) and Review of New Medication or Change in Dosage (2)  Treatment Plan Summary: Patient seems to be subacute delirium is probably secondary to her current medications like steroids and pain medication. Patient benefit from out-of-home placement as she cannot care for herself and family was not able to  support her at this time. Daily contact with patient to assess and evaluate symptoms and progress in treatment and Medication management  Plan: Case discussed with Dr. Verlon Au ,patient son who was at bedside and psychiatric social service Patient may benefit from the out-of-home placement for short period when medically stable Increase Seroquel to 50 mg tonight and reassess tomorrow.  Patient does not meet criteria for psychiatric inpatient admission. Supportive therapy provided about ongoing stressors.  Appreciate psychiatric consultation Please contact [phone] or [phone] if needs further assistance   Disposition: Will refer to the outpatient medication management when medically stable.  Maeli Spacek,JANARDHAHA R. 07/18/2015 3:57 PM

## 2015-07-18 NOTE — Care Management Note (Signed)
Case Management Note  Patient Details  Name: Kelly Webster MRN: [id number] Date of Birth: 02-Dec-1930  Subjective/Objective: Son very upset about current recommendations of home w/HHC, since per notes there is no skilled need-all custodial level.PT-no f/u, Psych-otpt med mgmnt. Attending will address concerns with family.                   Action/Plan:   Expected Discharge Date:                  Expected Discharge Plan:  [place]  In-House Referral:     Discharge planning Services  CM Consult  Post Acute Care Choice:    Choice offered to:     DME Arranged:    DME Agency:     HH Arranged:    [place] Agency:     Status of Service:  In process, will continue to follow  Medicare Important Message Given:    Date Medicare IM Given:    Medicare IM give by:    Date Additional Medicare IM Given:    Additional Medicare Important Message give by:     If discussed at [place] of Stay Meetings, dates discussed:    Additional Comments:  Dessa Phi, RN 07/18/2015, 2:59 PM

## 2015-07-18 NOTE — Progress Notes (Signed)
Kelly Webster:[id number] DOB: 07/14/1931 DOA: 07/15/2015 PCP: Donnajean Lopes, MD  Brief narrative:  59 yr ?stage IA ER PR positive invasive cancer of the right breast, status post right breast lumpectomy. She was treated with lumpectomy revealing a 1.3 cm invasive tumor             Recently seen in [facility] and sent home Was on Prednisone and has been on Oxycodone for LBP -Family states that 5-6 weeks prior to admission patient was having significant back pain and started on a bunch of doses of Solu-Medrol, oxycodone.  She was placed on these medications and recently as late as 07/07/15 tapered off of her steroids. Since then she has been confused and not completely herself She's been more fidgety and has had significant episodic repetitive movements and cannot tell the date the time the year Family states that this memory issue is chronic but this presentation is more subacute Over the past one half year the son relates that patient has been somewhat confused and missing things                           Consultants:  psychiatry  Procedures:    Antibiotics:  None    Subjective   Much more coherent Tolerating diet Able to tell me exactly where she is in what has been going on Was able to dial a telephone number of her relative from memory Son states she looks much better   Objective    Interim History:   Telemetry: sinus   Objective: Filed Vitals:   07/16/15 1424 07/17/15 0530 07/17/15 1339 07/17/15 2234  BP: 133/57 122/57 131/44 117/68  Pulse: 75 76 90 90  Temp: 97.9 F (36.6 C) 97.7 F (36.5 C) 97.9 F (36.6 C) 97.2 F (36.2 C)  TempSrc: Oral Oral Oral Oral  Resp: 16 16 20 20   Height:      Weight:      SpO2: 98% 98% 97% 97%    Intake/Output Summary (Last 24 hours) at 07/18/15 1045 Last data filed at 07/18/15 0370  Gross per 24 hour  Intake   1320 ml  Output      0 ml  Net   1320 ml    Exam:  EOMI ncat Less confused tno m No ict/pallor cta  b abd soft Nt NNd   Data Reviewed: Basic Metabolic Panel:  Recent Labs Lab 07/14/15 1631 07/15/15 2352 07/16/15 0500 07/17/15 0524  NA 130* 134* 135 136  K 3.7 3.8 3.9 4.0  CL 95* 100* 101 104  CO2 27 26 26 26   GLUCOSE 111* 122* 96 106*  BUN 19 16 14 15   CREATININE 0.66 0.65 0.62 0.70  CALCIUM 10.3 9.9 10.1 10.1   Liver Function Tests:  Recent Labs Lab 07/14/15 1631 07/15/15 2352 07/16/15 0500  AST 28 25 20   ALT 26 27 24   ALKPHOS 46 45 44  BILITOT 0.8 0.6 0.8  PROT 7.3 6.6 6.6  ALBUMIN 4.0 3.8 3.9   No results for input(s): LIPASE, AMYLASE in the last 168 hours.  Recent Labs Lab 07/15/15 2352  AMMONIA 45*   CBC:  Recent Labs Lab 07/14/15 1631 07/15/15 2352 07/16/15 0500 07/16/15 0525 07/17/15 0524  WBC 6.6 6.3 5.4  --  4.8  NEUTROABS 3.9 3.9  --   --   --   HGB 11.1* 10.8* 10.5*  --  10.1*  HCT 31.5* 31.8* 31.1* 31.3* 30.5*  MCV  91.8 92.2 92.6  --  93.0  PLT 290 273 275  --  252   Cardiac Enzymes:  Recent Labs Lab 07/14/15 1631  TROPONINI <0.03   BNP: Invalid input(s): POCBNP CBG:  Recent Labs Lab 07/17/15 0729 07/17/15 1208 07/17/15 1705 07/17/15 2247 07/18/15 0737  GLUCAP 94 110* 111* 106* 94    Recent Results (from the past 240 hour(s))  Urine culture     Status: None   Collection Time: 07/14/15  4:17 PM  Result Value Ref Range Status   Specimen Description URINE, CLEAN CATCH  Final   Special Requests NONE  Final   Culture   Final    MULTIPLE SPECIES PRESENT, SUGGEST RECOLLECTION Performed at [facility]    Report Status 07/16/2015 FINAL  Final     Studies:              All Imaging reviewed and is as per above notation   Scheduled Meds: . allopurinol  300 mg Oral Daily  . colchicine  0.6 mg Oral Daily  . folic acid  1 mg Oral Daily  . heparin  5,000 Units Subcutaneous 3 times per day  . losartan  50 mg Oral Daily   And  . hydrochlorothiazide  12.5 mg Oral Daily  . insulin aspart  0-15 Units Subcutaneous  TID WC  . metFORMIN  250 mg Oral Q breakfast  . multivitamin with minerals  1 tablet Oral Daily  . QUEtiapine  25 mg Oral QHS  . sodium chloride  3 mL Intravenous Q12H  . thiamine  100 mg Oral Daily   Continuous Infusions: . sodium chloride 50 mL/hr at 07/16/15 2057     Assessment/Plan:  Toxic metabolic encephalopathy with superimposed delirum from meds -limit all pain meds/prednisone -no more oral pain meds for LBP, so consider patches or Capsaicin -Saline lock IV 8/19 as tolerating diet well -Appreciate Dr. Lenna Sciara opinion psychaitry, continue Seroquel 25 mg daily at bedtime -Potentially limit duration of use of Seroquel given black box warning/beers criteria medication in terms of increased mortality with this class of agents in the elderly  Br Cancer s/p lumpectomy -stable  Htn-Well-controlled now  Continue HCTZ 12.5 qd Cont Losartan 50 qd  DM ty II Continue Metfromin 250 q am  Mild normocytic anemia -stable    Code Status: Full code Family Communication: discussed with  patient's son and healthcare power of attorney Diane at the bedside  Disposition Plan: await physical therapy input and likely may be able to discharge home if close to baseline in a.m. as was able to transfer from bed to restroom without difficulty this a.m. 8/19 DVT prophylaxis: SCD Consultants:   Verneita Griffes, MD  [facility] Pager [phone] 07/18/2015, 10:45 AM    LOS: 1 day

## 2015-07-19 LAB — GLUCOSE, CAPILLARY
GLUCOSE-CAPILLARY: 118 mg/dL — AB (ref 65–99)
GLUCOSE-CAPILLARY: 124 mg/dL — AB (ref 65–99)
Glucose-Capillary: 93 mg/dL (ref 65–99)
Glucose-Capillary: 96 mg/dL (ref 65–99)
Glucose-Capillary: 97 mg/dL (ref 65–99)

## 2015-07-19 NOTE — Progress Notes (Signed)
Kelly Webster OQH:[id number] DOB: [birth year] DOA: 07/15/2015 PCP: Donnajean Lopes, MD  Brief narrative:   32 yr ?stage IA ER PR positive invasive cancer of the right breast, status post right breast lumpectomy. She was treated with lumpectomy revealing a 1.3 cm invasive tumor             Recently seen in [facility] and sent home Was on Prednisone and has been on Oxycodone for LBP -Family states that 5-6 weeks prior to admission patient was having significant back pain and started on a bunch of doses of Solu-Medrol, oxycodone.  She was placed on these medications and recently as late as 07/07/15 tapered off of her steroids. Since then she has been confused and not completely herself She's been more fidgety and has had significant episodic repetitive movements and cannot tell the date the time the year Family states that this memory issue is chronic but this presentation is more subacute Over the past one half year the son relates that patient has been somewhat confused and missing things Work-up done so far unrevealing Psychiatry consulted Neurology also consulted for opinion                           Consultants:  psychiatry   Antibiotics:  None    Subjective   Confused again Waxing and waning Sleeping poorly Somewhat agitated    Objective    Interim History:   Telemetry: sinus   Objective: Filed Vitals:   07/17/15 2234 07/18/15 1359 07/19/15 0558 07/19/15 1414  BP: 117/68 123/59 122/66 121/60  Pulse: 90 85 87 84  Temp: 97.2 F (36.2 C) 98.2 F (36.8 C) 98.1 F (36.7 C) 97.5 F (36.4 C)  TempSrc: Oral Oral Oral Oral  Resp: 20 18 18 18   Height:      Weight:      SpO2: 97% 97% 98% 98%    Intake/Output Summary (Last 24 hours) at 07/19/15 1643 Last data filed at 07/18/15 2224  Gross per 24 hour  Intake    483 ml  Output      0 ml  Net    483 ml    Exam:  EOMI ncat Less confused tno m No ict/pallor cta b abd soft Nt NNd   Data Reviewed: Basic  Metabolic Panel:  Recent Labs Lab 07/14/15 1631 07/15/15 2352 07/16/15 0500 07/17/15 0524  NA 130* 134* 135 136  K 3.7 3.8 3.9 4.0  CL 95* 100* 101 104  CO2 27 26 26 26   GLUCOSE 111* 122* 96 106*  BUN 19 16 14 15   CREATININE 0.66 0.65 0.62 0.70  CALCIUM 10.3 9.9 10.1 10.1   Liver Function Tests:  Recent Labs Lab 07/14/15 1631 07/15/15 2352 07/16/15 0500  AST 28 25 20   ALT 26 27 24   ALKPHOS 46 45 44  BILITOT 0.8 0.6 0.8  PROT 7.3 6.6 6.6  ALBUMIN 4.0 3.8 3.9   No results for input(s): LIPASE, AMYLASE in the last 168 hours.  Recent Labs Lab 07/15/15 2352  AMMONIA 45*   CBC:  Recent Labs Lab 07/14/15 1631 07/15/15 2352 07/16/15 0500 07/16/15 0525 07/17/15 0524  WBC 6.6 6.3 5.4  --  4.8  NEUTROABS 3.9 3.9  --   --   --   HGB 11.1* 10.8* 10.5*  --  10.1*  HCT 31.5* 31.8* 31.1* 31.3* 30.5*  MCV 91.8 92.2 92.6  --  93.0  PLT 290 273 275  --  252   Cardiac Enzymes:  Recent Labs Lab 07/14/15 1631  TROPONINI <0.03   BNP: Invalid input(s): POCBNP CBG:  Recent Labs Lab 07/18/15 1154 07/18/15 1555 07/19/15 0012 07/19/15 0756 07/19/15 1209  GLUCAP 102* 101* 96 93 97    Recent Results (from the past 240 hour(s))  Urine culture     Status: None   Collection Time: 07/14/15  4:17 PM  Result Value Ref Range Status   Specimen Description URINE, CLEAN CATCH  Final   Special Requests NONE  Final   Culture   Final    MULTIPLE SPECIES PRESENT, SUGGEST RECOLLECTION Performed at [facility]    Report Status 07/16/2015 FINAL  Final     Studies:              All Imaging reviewed and is as per above notation   Scheduled Meds: . allopurinol  300 mg Oral Daily  . colchicine  0.6 mg Oral Daily  . folic acid  1 mg Oral Daily  . heparin  5,000 Units Subcutaneous 3 times per day  . losartan  50 mg Oral Daily   And  . hydrochlorothiazide  12.5 mg Oral Daily  . insulin aspart  0-15 Units Subcutaneous TID WC  . metFORMIN  250 mg Oral Q breakfast    . multivitamin with minerals  1 tablet Oral Daily  . QUEtiapine  25 mg Oral QHS  . sodium chloride  3 mL Intravenous Q12H  . thiamine  100 mg Oral Daily   Continuous Infusions:     Assessment/Plan:  Toxic metabolic encephalopathy with superimposed delirum from meds -limit all pain meds/prednisone -no more oral pain meds for LBP, so consider patches or Capsaicin -Saline lock IV 8/19 as tolerating diet well -Appreciate Dr. Lenna Sciara opinion psychaitry, continue Seroquel 25 mg daily at bedtime -Potentially limit duration of use of Seroquel given black box warning/beers criteria medication in terms of increased mortality with this class of agents in the elderly  -Neurology consulted for opinion re: whether this could be another type of process-ordered hiv/b12/rpr -Appreciate Input from Neuro in advance  Br Cancer s/p lumpectomy -stable  Htn-Well-controlled now  Continue HCTZ 12.5 qd Cont Losartan 50 qd  DM ty II Continue Metfromin 250 q am  Mild normocytic anemia -stable    Code Status: Full code Family Communication: discussed with  patient's son and healthcare power of attorney Diane at the bedside  Disposition Plan:unclear.  Neuro input is pending DVT prophylaxis: SCD Consultants:   Verneita Griffes, MD  [facility] Pager [phone] 07/19/2015, 4:43 PM    LOS: 2 days

## 2015-07-19 NOTE — Progress Notes (Signed)
Chaplain visited at the request of [name]'s family. 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 Family desires her to be placed in a facility but finds that her insurance does not cover such. The fear her living alone and do not feel capable of giving her 24/7 care.  The spiritual aspect to this is that [name] is very religious and feels that God will lead her to make life choices that will be best for her. She worries that family members may pressure her into something that conflicts with her spiritual leading. She admits confusion in recent past, but says she is not as confused. Her other fear is being placed in a facility that is not clean like [facility]. She does not want to be placed in an unkept facility and forgotten.  [name] says that she has had good visits with the LCSW and physicians and that she has been heard during these visits. She is not sure family hears her wishes.  Chaplains are available when needed or requested.  Sallee Lange. Dimetrius Montfort, DMin, MDiv Chaplain

## 2015-07-20 LAB — GLUCOSE, CAPILLARY
GLUCOSE-CAPILLARY: 82 mg/dL (ref 65–99)
GLUCOSE-CAPILLARY: 104 mg/dL — AB (ref 65–99)
Glucose-Capillary: 85 mg/dL (ref 65–99)

## 2015-07-20 LAB — RPR: RPR Ser Ql: NONREACTIVE

## 2015-07-20 LAB — HIV ANTIBODY (ROUTINE TESTING W REFLEX): HIV SCREEN 4TH GENERATION: NONREACTIVE

## 2015-07-20 LAB — VITAMIN B12: VITAMIN B 12: 846 pg/mL (ref 180–914)

## 2015-07-20 NOTE — Consult Note (Signed)
Reason for Consult:Altered mental status Referring Physician: Samtani  CC: Altered mental status  HPI: Kelly Webster is an 81 y.o. female who is unable to provide any history today.  History obtained from [facility] (son).  It appears that prior to last weekend the patient was doing fairly well.  She was living alone.  She was driving and keeping up with her own medications and doctor's appointments.  She did have problems with short term memory at times but it did not interfere with her ability to operate independently.  She had been having back issues since the beginning of July.  Patient was seen on an outpatient basis and given Oxycodone and steroids.  It seems that she also had at leas at one cortisone injection, if not more than one.   Last weekend she was found by her son at the store, confused.  She could not figure out how to pay for her goods.  She went home and got a credit card statement and went back to the store and attempted to pay with the statement.  Her family was able to get her home.  ON the next day she was found dialing numbers over and over again but not able to reach anyone because she could not dial the correct number.  Later that evening when she went to the bathroom she did not turn off the water and it overflowed.  They went to the ED on $Rem'Sunday night (8/15) where nothing abnormal was noted.  Head CT at that time was unremarkable.  On Monday the patient was noted to not be eating.  Was constantly repeating things and fiddling with things repeatedly.  There was some improvement on Tuesday but by Wednesday or Thursday things were right back where they had been before.  She was constantly rearranging things.  She was going to the bathroom constantly but did not have to use the bathroom. Was not sleeping and when watching television thought she was watching her family members.  Since being in the hospital she has had some lucid periods.   Patient on Oxycodone and Prednisone prior to  admission.  Does not appear that the Oxycodone was misused.  Unclear about use of the Prednisone.    Past Medical History  Diagnosis Date  . Hypertension   . Diabetes mellitus without complication   . Hx of seasonal allergies   . Arthritis     gouty arthritis knees and hands    Past Surgical History  Procedure Laterality Date  . Pilonidal cyst excision      1957  . Appendectomy      1967  . Abdominal hysterectomy      1981  . Carpal tunnel release Left     19'xcjd$ 88  . Breast lumpectomy with needle localization Right 07/27/2013    Procedure: BREAST LUMPECTOMY WITH NEEDLE LOCALIZATION;  Surgeon: Edward Jolly, MD;  Location: [facility];  Service: General;  Laterality: Right;    Family History  Problem Relation Age of Onset  . Colon cancer Sister   . Colon cancer Maternal Uncle   . Lung cancer Maternal Uncle   . Bone cancer Cousin     Social History:  reports that she quit smoking about 34 years ago. Her smoking use included Cigarettes. She smoked 0.25 packs per day. She does not have any smokeless tobacco history on file. She reports that she does not drink alcohol or use illicit drugs.  Allergies  Allergen Reactions  . Demerol [  Meperidine] Nausea Only    Medications:  I have reviewed the patient's current medications. Prior to Admission:  Prescriptions prior to admission  Medication Sig Dispense Refill Last Dose  . allopurinol (ZYLOPRIM) 300 MG tablet Take 300 mg by mouth daily.     . Ascorbic Acid (VITAMIN C) 1000 MG tablet Take 1,000 mg by mouth 2 (two) times daily.    Past Week at Unknown time  . colchicine 0.6 MG tablet Take 0.6 mg by mouth daily.     Marland Kitchen etodolac (LODINE) 300 MG capsule Take 300 mg by mouth daily as needed (joint pain.).     Marland Kitchen gemfibrozil (LOPID) 600 MG tablet Take 600 mg by mouth daily.     Marland Kitchen ibuprofen (ADVIL,MOTRIN) 200 MG tablet Take 200 mg by mouth every 6 (six) hours as needed for pain.   Past Month at Unknown time  .  loratadine (CLARITIN) 10 MG tablet Take 10 mg by mouth daily.   Past Week at Unknown time  . losartan-hydrochlorothiazide (HYZAAR) 50-12.5 MG per tablet Take 1 tablet by mouth daily.   Past Week at Unknown time  . metFORMIN (GLUCOPHAGE) 500 MG tablet Take 500 mg by mouth daily.    Past Week at Unknown time  . Multiple Vitamin (MULTI-VITAMIN PO) Take 1 tablet by mouth daily.   Past Week at Unknown time  . omeprazole (PRILOSEC) 20 MG capsule Take 20 mg by mouth daily as needed (acid reflux).     Marland Kitchen oxyCODONE (OXY IR/ROXICODONE) 5 MG immediate release tablet Take 5 mg by mouth every 12 (twelve) hours as needed for moderate pain or severe pain.   0 Past Week at Unknown time  . predniSONE (DELTASONE) 5 MG tablet Take 5 mg by mouth See admin instructions. 5mg  (6 day) taper pack: 6 tabs daily, decreasing by 1 tab daily until gone     . Vitamin E 400 UNITS TABS Take 400 Units by mouth daily.    Past Week at Unknown time  . [DISCONTINUED] BIOTIN 5000 PO Take 2 tablets by mouth 2 (two) times daily.   Past Week at Unknown time  . [DISCONTINUED] Fish Oil-Cholecalciferol (FISH OIL + D3) 1200-1000 MG-UNIT CAPS Take 2 tablets by mouth 2 (two) times daily.   Past Week at Unknown time  . [DISCONTINUED] GLUCOSAMINE-CHONDROITIN DS PO Take 2 tablets by mouth daily.   Past Week at Unknown time   Scheduled: . allopurinol  300 mg Oral Daily  . colchicine  0.6 mg Oral Daily  . folic acid  1 mg Oral Daily  . heparin  5,000 Units Subcutaneous 3 times per day  . losartan  50 mg Oral Daily   And  . hydrochlorothiazide  12.5 mg Oral Daily  . insulin aspart  0-15 Units Subcutaneous TID WC  . metFORMIN  250 mg Oral Q breakfast  . multivitamin with minerals  1 tablet Oral Daily  . QUEtiapine  25 mg Oral QHS  . sodium chloride  3 mL Intravenous Q12H  . thiamine  100 mg Oral Daily    ROS: History obtained from the patient and son  General ROS: negative for - chills, fatigue, fever, night sweats, weight gain or weight  loss Psychological ROS: as noted in HPI Ophthalmic ROS: negative for - blurry vision, double vision, eye pain or loss of vision ENT ROS: negative for - epistaxis, nasal discharge, oral lesions, sore throat, tinnitus or vertigo Allergy and Immunology ROS: negative for - hives or itchy/watery eyes Hematological and Lymphatic ROS:  negative for - bleeding problems, bruising or swollen lymph nodes Endocrine ROS: negative for - galactorrhea, hair pattern changes, polydipsia/polyuria or temperature intolerance Respiratory ROS: negative for - cough, hemoptysis, shortness of breath or wheezing Cardiovascular ROS: negative for - chest pain, dyspnea on exertion, edema or irregular heartbeat Gastrointestinal ROS: negative for - abdominal pain, diarrhea, hematemesis, nausea/vomiting or stool incontinence Genito-Urinary ROS: negative for - dysuria, hematuria, incontinence or urinary frequency/urgency Musculoskeletal ROS: as noted in HPI Neurological ROS: as noted in HPI Dermatological ROS: negative for rash and skin lesion changes  Physical Examination: Blood pressure 101/62, pulse 89, temperature 97.9 F (36.6 C), temperature source Oral, resp. rate 18, height $RemoveBe'4\' 9"'hvRvOmMOe$  (1.448 m), weight 62.5 kg (137 lb 12.6 oz), SpO2 94 %.  Gen: NAD HEENT-  Normocephalic, no lesions, without obvious abnormality.  Normal external eye and conjunctiva.  Normal TM's bilaterally.  Normal auditory canals and external ears. Normal external nose, mucus membranes and septum.  Normal pharynx. Cardiovascular- S1, S2 normal, pulses palpable throughout   Lungs- chest clear, no wheezing, rales, normal symmetric air entry Abdomen- soft, non-tender; bowel sounds normal; no masses,  no organomegaly Extremities- no edema Lymph-no adenopathy palpable Musculoskeletal-no joint tenderness, deformity or swelling Skin-warm and dry, no hyperpigmentation, vitiligo, or suspicious lesions  Neurological Examination Mental Status: Alert, oriented,  able to perform some simple math calculations.  While talking with son patient was constantly rearranging things on her bed and appeared slightly agitated.  Speech fluent without evidence of aphasia.  Able to follow 3 step commands without difficulty. Cranial Nerves: II: Discs flat bilaterally; Visual fields grossly normal, pupils equal, round, reactive to light and accommodation III,IV, VI: ptosis not present, extra-ocular motions intact bilaterally V,VII: smile symmetric, facial light touch sensation normal bilaterally VIII: hearing normal bilaterally IX,X: gag reflex present XI: bilateral shoulder shrug XII: midline tongue extension Motor: Right : Upper extremity   5/5    Left:     Upper extremity   5/5  Lower extremity   5/5     Lower extremity   5/5 Tone and bulk:normal tone throughout; no atrophy noted Sensory: Pinprick and light touch intact throughout, bilaterally Deep Tendon Reflexes: 2+ and symmetric throughout Plantars: Right: downgoing   Left: downgoing Cerebellar: normal finger-to-nose and normal heel-to-shin testing bilaterally       Laboratory Studies:   Basic Metabolic Panel:  Recent Labs Lab 07/14/15 1631 07/15/15 2352 07/16/15 0500 07/17/15 0524  NA 130* 134* 135 136  K 3.7 3.8 3.9 4.0  CL 95* 100* 101 104  CO2 $Re'27 26 26 26  'teU$ GLUCOSE 111* 122* 96 106*  BUN $Re'19 16 14 15  'vdB$ CREATININE 0.66 0.65 0.62 0.70  CALCIUM 10.3 9.9 10.1 10.1    Liver Function Tests:  Recent Labs Lab 07/14/15 1631 07/15/15 2352 07/16/15 0500  AST $Re'28 25 20  'Hce$ ALT $R'26 27 24  'Mg$ ALKPHOS 46 45 44  BILITOT 0.8 0.6 0.8  PROT 7.3 6.6 6.6  ALBUMIN 4.0 3.8 3.9   No results for input(s): LIPASE, AMYLASE in the last 168 hours.  Recent Labs Lab 07/15/15 2352  AMMONIA 45*    CBC:  Recent Labs Lab 07/14/15 1631 07/15/15 2352 07/16/15 0500 07/16/15 0525 07/17/15 0524  WBC 6.6 6.3 5.4  --  4.8  NEUTROABS 3.9 3.9  --   --   --   HGB 11.1* 10.8* 10.5*  --  10.1*  HCT 31.5* 31.8*  31.1* 31.3* 30.5*  MCV 91.8 92.2 92.6  --  93.0  PLT  290 273 275  --  252    Cardiac Enzymes:  Recent Labs Lab 07/14/15 1631  TROPONINI <0.03    BNP: Invalid input(s): POCBNP  CBG:  Recent Labs Lab 07/19/15 1209 07/19/15 1649 07/19/15 2137 07/20/15 0742 07/20/15 1149  GLUCAP 97 118* 124* 85 82    Microbiology: Results for orders placed or performed during the hospital encounter of 07/14/15  Urine culture     Status: None   Collection Time: 07/14/15  4:17 PM  Result Value Ref Range Status   Specimen Description URINE, CLEAN CATCH  Final   Special Requests NONE  Final   Culture   Final    MULTIPLE SPECIES PRESENT, SUGGEST RECOLLECTION Performed at [facility]    Report Status 07/16/2015 FINAL  Final    Coagulation Studies: No results for input(s): LABPROT, INR in the last 72 hours.  Urinalysis:   Recent Labs Lab 07/14/15 1616 07/15/15 2233  COLORURINE YELLOW YELLOW  LABSPEC 1.006 1.012  PHURINE 6.0 6.0  GLUCOSEU NEGATIVE NEGATIVE  HGBUR NEGATIVE NEGATIVE  BILIRUBINUR NEGATIVE NEGATIVE  KETONESUR NEGATIVE NEGATIVE  PROTEINUR NEGATIVE 30*  UROBILINOGEN 0.2 0.2  NITRITE NEGATIVE NEGATIVE  LEUKOCYTESUR MODERATE* SMALL*    Lipid Panel:  No results found for: CHOL, TRIG, HDL, CHOLHDL, VLDL, LDLCALC  HgbA1C:  Lab Results  Component Value Date   HGBA1C 6.1* 07/16/2015    Urine Drug Screen:      Component Value Date/Time   LABOPIA NONE DETECTED 07/15/2015 2233   COCAINSCRNUR NONE DETECTED 07/15/2015 2233   LABBENZ NONE DETECTED 07/15/2015 2233   AMPHETMU NONE DETECTED 07/15/2015 2233   THCU NONE DETECTED 07/15/2015 2233   LABBARB NONE DETECTED 07/15/2015 2233    Alcohol Level: No results for input(s): ETH in the last 168 hours.  Other results: EKG: (8/16) sinus rhythm at 88 bpm.  MRI brain without contrast (07/16/2015) : IMPRESSION: 1. No acute intracranial infarct or other abnormality identified. 2. Generalized age-related  cerebral atrophy with mild chronic small vessel ischemic disease   Assessment/Plan: 81 year old female with altered mental status for the past week.  Although there may have been some short term memory issues that were mild prior to then she was clearly much more functional prior to a week ago.  She has been on narcotics and steroids.  Many of her symptoms are classic for steroid use.  These medications will take some time before their effects will be completely gone, particularly if she has had injections as well.  She has also had some metabolic issues since admission as well.  Many of these are clearing.  Unfortunately often cognitive improvement lags behind improvement in lab work.  MRI of the brain independently reviewed and shows no acute changes.   B12, RPR, folate, TSH are normal.  Recommendations: 1.  Agree with discontinuation of steroids  2.  ESR, repeat NH4, cortisol level   Alexis Goodell, MD [facility] [phone] 07/20/2015, 3:23 PM

## 2015-07-20 NOTE — Progress Notes (Signed)
Kelly Webster VJD:[id number] DOB: 1931/06/15 DOA: 07/15/2015 PCP: Donnajean Lopes, MD  Brief narrative:   72 yr ?stage IA ER PR positive invasive cancer of the right breast, status post right breast lumpectomy. She was treated with lumpectomy revealing a 1.3 cm invasive tumor             Recently seen in [facility] and sent home Was on Prednisone and has been on Oxycodone for LBP -Family states that 5-6 weeks prior to admission patient was having significant back pain and started on a bunch of doses of Solu-Medrol, oxycodone.  She was placed on these medications and recently as late as 07/07/15 tapered off of her steroids. Since then she has been confused and not completely herself She's been more fidgety and has had significant episodic repetitive movements and cannot tell the date the time the year Family states that this memory issue is chronic but this presentation is more subacute Over the past one half year the son relates that patient has been somewhat confused and missing things Work-up done so far unrevealing Psychiatry consulted Neurology also consulted for opinion Pending are ammonia level, free cortisol We ordered B12 for gas and HIV which are negative                           Consultants:  psychiatry   Antibiotics:  None    Subjective   Still confused Pleasant Sitting up in bed in He thinks that my name is Dr. Donnetta Hutching No other issues at present according to family Friend tells me that she has had some significant memory lapses over the past 8-12 months    Objective    Interim History:   Telemetry: sinus   Objective: Filed Vitals:   07/19/15 1414 07/19/15 2131 07/20/15 0605 07/20/15 1306  BP: 121/60 117/52 116/51 101/62  Pulse: 84 94 78 89  Temp: 97.5 F (36.4 C) 97.8 F (36.6 C) 97.7 F (36.5 C) 97.9 F (36.6 C)  TempSrc: Oral Oral Oral Oral  Resp: $Remo'18 18 18 18  'AHGrl$ Height:      Weight:      SpO2: 98% 94% 95% 94%    Intake/Output Summary (Last 24  hours) at 07/20/15 1604 Last data filed at 07/20/15 0830  Gross per 24 hour  Intake    820 ml  Output      0 ml  Net    820 ml    Exam:  EOMI ncat Less confused tno m No ict/pallor cta b abd soft Nt NNd   Data Reviewed: Basic Metabolic Panel:  Recent Labs Lab 07/14/15 1631 07/15/15 2352 07/16/15 0500 07/17/15 0524  NA 130* 134* 135 136  K 3.7 3.8 3.9 4.0  CL 95* 100* 101 104  CO2 $Re'27 26 26 26  'dhY$ GLUCOSE 111* 122* 96 106*  BUN $Re'19 16 14 15  'iPK$ CREATININE 0.66 0.65 0.62 0.70  CALCIUM 10.3 9.9 10.1 10.1   Liver Function Tests:  Recent Labs Lab 07/14/15 1631 07/15/15 2352 07/16/15 0500  AST $Re'28 25 20  'IHm$ ALT $R'26 27 24  'Hu$ ALKPHOS 46 45 44  BILITOT 0.8 0.6 0.8  PROT 7.3 6.6 6.6  ALBUMIN 4.0 3.8 3.9   No results for input(s): LIPASE, AMYLASE in the last 168 hours.  Recent Labs Lab 07/15/15 2352  AMMONIA 45*   CBC:  Recent Labs Lab 07/14/15 1631 07/15/15 2352 07/16/15 0500 07/16/15 0525 07/17/15 0524  WBC 6.6 6.3 5.4  --  4.8  NEUTROABS 3.9 3.9  --   --   --   HGB 11.1* 10.8* 10.5*  --  10.1*  HCT 31.5* 31.8* 31.1* 31.3* 30.5*  MCV 91.8 92.2 92.6  --  93.0  PLT 290 273 275  --  252   Cardiac Enzymes:  Recent Labs Lab 07/14/15 1631  TROPONINI <0.03   BNP: Invalid input(s): POCBNP CBG:  Recent Labs Lab 07/19/15 1209 07/19/15 1649 07/19/15 2137 07/20/15 0742 07/20/15 1149  GLUCAP 97 118* 124* 85 82    Recent Results (from the past 240 hour(s))  Urine culture     Status: None   Collection Time: 07/14/15  4:17 PM  Result Value Ref Range Status   Specimen Description URINE, CLEAN CATCH  Final   Special Requests NONE  Final   Culture   Final    MULTIPLE SPECIES PRESENT, SUGGEST RECOLLECTION Performed at [facility]    Report Status 07/16/2015 FINAL  Final     Studies:              All Imaging reviewed and is as per above notation   Scheduled Meds: . allopurinol  300 mg Oral Daily  . colchicine  0.6 mg Oral Daily  . folic acid   1 mg Oral Daily  . heparin  5,000 Units Subcutaneous 3 times per day  . losartan  50 mg Oral Daily   And  . hydrochlorothiazide  12.5 mg Oral Daily  . insulin aspart  0-15 Units Subcutaneous TID WC  . metFORMIN  250 mg Oral Q breakfast  . multivitamin with minerals  1 tablet Oral Daily  . QUEtiapine  25 mg Oral QHS  . sodium chloride  3 mL Intravenous Q12H  . thiamine  100 mg Oral Daily   Continuous Infusions:     Assessment/Plan:  Toxic metabolic encephalopathy with superimposed delirum from meds -limit all pain meds/prednisone -no more oral pain meds for LBP, so consider patches or Capsaicin -Saline lock IV 8/19 as tolerating diet well -Appreciate Dr. Lenna Sciara opinion psychaitry, continue Seroquel 25 mg daily at bedtime -Potentially limit duration of use of Seroquel given black box warning/beers criteria medication in terms of increased mortality with this class of agents in the elderly  -Neurology consulted for opinion re: whether this could be another type of process-ordered Hiv/B12/RpR -Pending are ESR, ammonia, cortisol  Br Cancer s/p lumpectomy -stable  Htn-Well-controlled now  -Continue HCTZ 12.5 qd -Cont Losartan 50 qd  DM ty II -Continue Metfromin 250 q am  Mild normocytic anemia -stable    Code Status: Full code Family Communication: called son at discussed POC and updated about Neuro rec's Disposition Plan: Home soon? doens' meet SNF level care or management DVT prophylaxis: SCD Consultants:   Verneita Griffes, MD  [facility] Pager [phone] 07/20/2015, 4:04 PM    LOS: 3 days

## 2015-07-21 DIAGNOSIS — F919 Conduct disorder, unspecified: Secondary | ICD-10-CM

## 2015-07-21 DIAGNOSIS — E119 Type 2 diabetes mellitus without complications: Secondary | ICD-10-CM

## 2015-07-21 DIAGNOSIS — R4189 Other symptoms and signs involving cognitive functions and awareness: Secondary | ICD-10-CM

## 2015-07-21 DIAGNOSIS — R404 Transient alteration of awareness: Secondary | ICD-10-CM

## 2015-07-21 DIAGNOSIS — I10 Essential (primary) hypertension: Secondary | ICD-10-CM

## 2015-07-21 DIAGNOSIS — R410 Disorientation, unspecified: Secondary | ICD-10-CM

## 2015-07-21 LAB — GLUCOSE, CAPILLARY
GLUCOSE-CAPILLARY: 95 mg/dL (ref 65–99)
GLUCOSE-CAPILLARY: 103 mg/dL — AB (ref 65–99)
GLUCOSE-CAPILLARY: 95 mg/dL (ref 65–99)
GLUCOSE-CAPILLARY: 100 mg/dL — AB (ref 65–99)
Glucose-Capillary: 98 mg/dL (ref 65–99)

## 2015-07-21 LAB — SEDIMENTATION RATE: Sed Rate: 77 mm/hr — ABNORMAL HIGH (ref 0–22)

## 2015-07-21 LAB — CORTISOL: Cortisol, Plasma: 21.3 ug/dL

## 2015-07-21 LAB — AMMONIA: Ammonia: <9 umol/L — ABNORMAL LOW (ref 9–35)

## 2015-07-21 MED ORDER — FOLIC ACID 1 MG PO TABS: 1.0000 mg | ORAL_TABLET | Freq: Every day | ORAL | Status: AC

## 2015-07-21 MED ORDER — QUETIAPINE FUMARATE 25 MG PO TABS: 25.0000 mg | ORAL_TABLET | Freq: Every day | ORAL | Status: AC

## 2015-07-21 NOTE — Evaluation (Signed)
Speech Language Pathology Evaluation Patient Details Name: Kelly Webster MRN: [id number] DOB: 1931/07/18 Today's Date: 07/21/2015 Time: 1450-1510 SLP Time Calculation (min) (ACUTE ONLY): 20 min  Problem List:  Patient Active Problem List   Diagnosis Date Noted  . HTN (hypertension) 07/16/2015  . Diabetes mellitus without complication 40/98/1191  . Altered mental status 07/16/2015  . Altered mental state 07/16/2015  . Cognitive and behavioral changes   . Delirium   . Breast cancer of upper-outer quadrant of right female breast 06/22/2013   Past Medical History:  Past Medical History  Diagnosis Date  . Hypertension   . Diabetes mellitus without complication   . Hx of seasonal allergies   . Arthritis     gouty arthritis knees and hands   Past Surgical History:  Past Surgical History  Procedure Laterality Date  . Pilonidal cyst excision      1957  . Appendectomy      1967  . Abdominal hysterectomy      1981  . Carpal tunnel release Left     1988  . Breast lumpectomy with needle localization Right 07/27/2013    Procedure: BREAST LUMPECTOMY WITH NEEDLE LOCALIZATION;  Surgeon: Edward Jolly, MD;  Location: [facility];  Service: General;  Laterality: Right;   HPI:  Pt is an 81 yo female with history of DM, HTN, invasive cancer of the right breast, status post right breast lumpectomy 2014, presents with altered mental status. She had been on prednisone for the last week and also had been on oxycodone for back pain. Pt admitted for toxic metabolic encephalopathy with superimposed delirum from medsd presents with altered mental status. She had been on prednisone for the last week and also had been on oxycodone for back pain. Pt admitted for toxic metabolic encephalopathy with superimposed delirum from meds.     Assessment / Plan / Recommendation Clinical Impression  Cognitive assessment reveals deficits in orientation to elements of time and situation;  functional attention; deficits in retrieval of new information and reduced prospective memory.  Pt's functional and verbal problem-solving is impaired. She had difficulty verbalizing solutions to hypothetical situations.  She had difficulty understanding her menu and television channel guide, confusing the purpose of each and unable to navigate through steps for their use.  Pt does verbalize that she is not "herself" and understands that she may need assistance for a period of time until her cognition improves.  Recommend 24 hour supervision for safety until encephalopathy clears.      SLP Assessment  All further Speech Language Pathology  needs can be addressed in the next venue of care    Follow Up Recommendations    no acute care SLP needs - 24 hour supervison           Pertinent Vitals/Pain Pain Assessment: No/denies pain   SLP Goals     SLP Evaluation Prior Functioning  Cognitive/Linguistic Baseline: Within functional limits Type of Home: House  Lives With: Alone Available Help at Discharge: Family;Available PRN/intermittently   Cognition  Overall Cognitive Status: Impaired/Different from baseline Arousal/Alertness: Awake/alert Orientation Level: Oriented to person;Oriented to place;Disoriented to time;Disoriented to situation ("2003" "[birth year]- my birthday is tomorrow.") Attention: Selective Selective Attention: Appears intact Memory: Impaired Memory Impairment: Retrieval deficit;Prospective memory Awareness: Impaired Awareness Impairment: Intellectual impairment Problem Solving: Impaired Problem Solving Impairment: Verbal basic;Functional basic Safety/Judgment: Impaired    Comprehension  Auditory Comprehension Overall Auditory Comprehension: Appears within functional limits for tasks assessed Visual Recognition/Discrimination Discrimination: Within Function Limits  Reading Comprehension Reading Status: Within funtional limits    Expression Expression Primary Mode of  Expression: Verbal Verbal Expression Overall Verbal Expression: Appears within functional limits for tasks assessed Written Expression Dominant Hand: Right   Oral / Motor Oral Motor/Sensory Function Overall Oral Motor/Sensory Function: Appears within functional limits for tasks assessed Motor Speech Overall Motor Speech: Appears within functional limits for tasks assessed   Kelly Webster, [place] CCC/SLP Pager [phone]      Kelly Webster 07/21/2015, 3:20 PM

## 2015-07-21 NOTE — Progress Notes (Signed)
CSW & RNCM, Juliann Pulse spoke with patient's son, Elenore Rota re: discharge plans. PT recommended no PT followup, patient ambulated 280' min guard/supervision. CSW explained to son that patient would not qualify to go to a SNF with insurance covering as there is no "skilled need" identified. CSW explained that patient could go to SNF under private pay. RNCM, Juliann Pulse provided son with private duty caregiver list and home health list & made referral to [facility].   No further CSW needs identified - CSW signing off.   Raynaldo Opitz, [facility] Clinical Social Worker cell #: [phone]

## 2015-07-21 NOTE — Progress Notes (Deleted)
Discharge to [facility], report given to [place]. P/u by Corey Harold, son is at bedside. PIV removed  No s/s of infiltration or swelling.

## 2015-07-21 NOTE — Care Management Note (Signed)
Case Management Note  Patient Details  Name: ERNESTINE ROHMAN MRN: [id number] Date of Birth: 1931/08/15  Subjective/Objective:  Noted MD spoke to son on phone to inform him of current treatment plan, PT/OT cons-await recommendations to discuss d/c plan & options.                  Action/Plan:Monitor progress for d/c needs.   Expected Discharge Date:                  Expected Discharge Plan:  [place]  In-House Referral:     Discharge planning Services  CM Consult  Post Acute Care Choice:    Choice offered to:     DME Arranged:    DME Agency:     HH Arranged:    [place] Agency:     Status of Service:  In process, will continue to follow  Medicare Important Message Given:    Date Medicare IM Given:    Medicare IM give by:    Date Additional Medicare IM Given:    Additional Medicare Important Message give by:     If discussed at [place] of Stay Meetings, dates discussed:    Additional Comments:  Dessa Phi, RN 07/21/2015, 2:02 PM

## 2015-07-21 NOTE — Progress Notes (Signed)
[facility]                                                                              Patient Demographics  Kelly Webster, is a 81 y.o. female, DOB - 26-Sep-1931, XTK:[id number]  Admit date - 07/15/2015   Admitting Physician Allyne Gee, MD  Outpatient Primary MD for the patient is Donnajean Lopes, MD  LOS - 4   Chief Complaint  Patient presents with  . Altered Mental Status    change noticed on Sunday. Pt seen yesterday for same thing.       Brief HPI   Patient is 81 year old female withstage IA ER PR positive invasive cancer of the right breast, status post right breast lumpectomy, diabetes, hypertension had originally presented to ED with altered mental status. Apparently patient was having back issues since the beginning of July and was being seen on an outpatient basis and was started on oxycodone and steroids. Patient also had at least one cortisone injection. Patient had been living alone. A weekend prior to admission, she was found by her son at the store confused and subsequently more cognitive issues including dialing wrong phone numbers and confused. Patient was seen in ED on 8/15 and head CT was normal. However for the next 3-4 days, patient had waxing and waning improvement. Patient was brought back to the ED due to no significant improvement in the mental status per the family.   Assessment & Plan    Principal Problem:   Delirium: Improving, likely prolonged side effect of steroids and narcotics  - So far workup unrevealing, psychiatry consulted, started on Seroquel daily - Neurology recommendations appreciated, MRI negative for any CVA - B12, folate, HIV, RPR, cortisol, ammonia level negative - will have PT, OT, SLP cognitive evaluation done today, overall improving. Discussed in detail with patient's son, Elenore Rota, strongly feels that patient will not be able to manage at home by herself, still feels she has significant cognitive deficits. Noticed  patient ambulating without any difficulty with physical therapist. I strongly feel that she does need supervision as likely the side effects will slowly wear off. Requested patient's son to see patient today and reassess from his standpoint for safe disposition. Also discussed with case management.  Active Problems:   HTN (hypertension) Currently stable, continue losartan HCTZ    Diabetes mellitus without complication - Continue metformin  CA breast status post lumpectomy Stable  Code Status: Full code  Family Communication: Discussed in detail with the patient, all imaging results, lab results explained to the patient and son on the phone    Disposition Plan: Hopefully DC tomorrow  Time Spent in minutes  20minutes  Procedures  MRI brain  Consults   Neurology Psychiatry  DVT Prophylaxis SCD  Medications  Scheduled Meds: . allopurinol  300 mg Oral Daily  . colchicine  0.6 mg Oral Daily  . folic acid  1 mg Oral Daily  . heparin  5,000 Units Subcutaneous 3 times per day  . losartan  50 mg Oral Daily   And  . hydrochlorothiazide  12.5 mg Oral Daily  . insulin aspart  0-15 Units Subcutaneous TID  WC  . metFORMIN  250 mg Oral Q breakfast  . multivitamin with minerals  1 tablet Oral Daily  . QUEtiapine  25 mg Oral QHS  . sodium chloride  3 mL Intravenous Q12H  . thiamine  100 mg Oral Daily   Continuous Infusions:  PRN Meds:.acetaminophen **OR** acetaminophen   Antibiotics   Anti-infectives    None        Subjective:   Kelly Webster was seen and examined today. Patient denies dizziness, chest pain, shortness of breath, abdominal pain, N/V/D/C, new weakness, numbess, tingling. No acute events overnight.    Objective:   Blood pressure 127/54, pulse 84, temperature 97.5 F (36.4 C), temperature source Oral, resp. rate 18, height 4\' 9"  (1.448 m), weight 60.1 kg (132 lb 7.9 oz), SpO2 100 %.  Wt Readings from Last 3 Encounters:  07/21/15 60.1 kg (132 lb 7.9 oz)    07/15/15 69.854 kg (154 lb)  04/15/14 69.854 kg (154 lb)     Intake/Output Summary (Last 24 hours) at 07/21/15 1227 Last data filed at 07/21/15 0914  Gross per 24 hour  Intake   1843 ml  Output      0 ml  Net   1843 ml    Exam  General: Alert and oriented x 3, NAD  HEENT:  PERRLA, EOMI, Anicteric Sclera, mucous membranes moist.   Neck: Supple, no JVD, no masses  CVS: S1 S2 auscultated, no rubs, murmurs or gallops. Regular rate and rhythm.  Respiratory: Clear to auscultation bilaterally, no wheezing, rales or rhonchi  Abdomen: Soft, nontender, nondistended, + bowel sounds  Ext: no cyanosis clubbing or edema  Neuro: AAOx3, Cr N's II- XII. Strength 5/5 upper and lower extremities bilaterally  Skin: No rashes  Psych: Normal affect and demeanor, alert and oriented x3    Data Review   Micro Results Recent Results (from the past 240 hour(s))  Urine culture     Status: None   Collection Time: 07/14/15  4:17 PM  Result Value Ref Range Status   Specimen Description URINE, CLEAN CATCH  Final   Special Requests NONE  Final   Culture   Final    MULTIPLE SPECIES PRESENT, SUGGEST RECOLLECTION Performed at [facility]    Report Status 07/16/2015 FINAL  Final    Radiology Reports Dg Chest 2 View  07/14/2015   CLINICAL DATA:  Confusion  EXAM: CHEST  2 VIEW  COMPARISON:  None.  FINDINGS: Lungs are clear.  No pleural effusion or pneumothorax.  The heart is normal in size.  Degenerative changes of the visualized thoracolumbar spine.  IMPRESSION: No evidence of acute cardiopulmonary disease.   Electronically Signed   By: Julian Hy M.D.   On: 07/14/2015 17:32   Ct Head Wo Contrast  07/14/2015   CLINICAL DATA:  Per pt family, pt was recently prescribed prednisone (5mg ), and oxycodone hcl (5 mg) for back pain. Pt became confused on Sat 8/13 at a store and family had to pick her up. Per family pt, has been having episodes of confusion and clarity since last week   EXAM: CT HEAD WITHOUT CONTRAST  TECHNIQUE: Contiguous axial images were obtained from the base of the skull through the vertex without intravenous contrast.  COMPARISON:  None.  FINDINGS: The ventricles are normal in configuration. There is age related ventricular and sulcal enlargement. No hydrocephalus.  There are no parenchymal masses or mass effect. There is no evidence of a cortical infarct. Mild white matter hypoattenuation is noted consistent  with chronic microvascular ischemic change.  There are no extra-axial masses or abnormal fluid collections.  There is no intracranial hemorrhage.  Single opacified posterior right ethmoid air cell. Minor dependent mucosal thickening in the left sphenoid sinus. Remaining visualized sinuses are clear as are the mastoid air cells. No skull lesion.  IMPRESSION: 1. No acute intracranial abnormalities. 2. Age related volume loss. Mild chronic microvascular ischemic change.   Electronically Signed   By: Lajean Manes M.D.   On: 07/14/2015 16:57   Ct Lumbar Spine W Contrast  07/01/2015   CLINICAL DATA:  Low back pain. LEFT hip pain. Symptoms developed after twisting injury.  EXAM: LUMBAR MYELOGRAM  FLUOROSCOPY TIME:  25 seconds corresponding to a Dose Area Product of 209 Gy*m2  PROCEDURE: After thorough discussion of risks and benefits of the procedure including bleeding, infection, injury to nerves, blood vessels, adjacent structures as well as headache and CSF leak, written and oral informed consent was obtained. Consent was obtained by Dr. Rolla Flatten. Time out form was completed.  Patient was positioned prone on the fluoroscopy table. Local anesthesia was provided with 1% lidocaine without epinephrine after prepped and draped in the usual sterile fashion. Puncture was performed at L5-S1 using a 3 1/2 inch 22-gauge spinal needle via midline approach. Using a single pass through the dura, the needle was placed within the thecal sac, with return of clear CSF. 15 mL of  Omnipaque-180 was injected into the thecal sac, with normal opacification of the nerve roots and cauda equina consistent with free flow within the subarachnoid space.  I personally performed the lumbar puncture and administered the intrathecal contrast. I also personally supervised acquisition of the myelogram images.  TECHNIQUE: Contiguous axial images were obtained through the Lumbar spine after the intrathecal infusion of infusion. Coronal and sagittal reconstructions were obtained of the axial image sets.  COMPARISON:  None  FINDINGS: LUMBAR MYELOGRAM FINDINGS:  Good opacification lumbar subarachnoid space. Degenerative scoliosis convex RIGHT with asymmetric loss of interspace height at L2-3 and L3-4 on the LEFT and asymmetric loss of interspace height at L4-5 on the RIGHT. Myelographically, lateral recess stenosis is apparent at L2-3 and L3-4 on the LEFT with effacement of the LEFT L3 and LEFT L4 nerve roots respectively. These changes are worse with patient upright, particularly at L3-4.  There is a large ventral defect at L1-2 consistent with a central disc extrusion. This lies below the conus. Trace retrolisthesis at this level appears facet mediated. There is moderate stenosis at this level with effacement of the LEFT L2 nerve root.  Aside from L1-2, the alignment is anatomic. There is no significant dynamic instability although the stenosis at L1-2 is worse with patient upright.  CT LUMBAR MYELOGRAM FINDINGS:  Segmentation: Normal.  Alignment:  Normal.  Vertebrae: No worrisome osseous lesion.  Conus medullaris: Normal in size, signal, and location.  Paraspinal tissues: No evidence for hydronephrosis or paravertebral mass. Moderate vascular calcification.  Disc levels:  L1-L2: Central and leftward disc extrusion. Trace retrolisthesis. Mild facet arthropathy. Slight effacement LEFT L2 nerve root. Mild central canal stenosis.  L2-L3: Shallow central protrusion. Marked leftward foraminal and extraforaminal  spurring. Mild facet arthropathy. No subarticular zone narrowing of significance. LEFT L2 nerve root impingement in the foramen is possible however.  L3-L4: Central and leftward protrusion extends into the foramen. Asymmetric facet arthropathy also on the LEFT of moderate degree. Asymmetric loss of interspace height on the LEFT. LEFT L4 and LEFT L3 nerve root impingement are likely.  L4-L5:  Moderate disc space narrowing is worse on the RIGHT. Asymmetric subarticular zone and foraminal zone narrowing due to a combination of disc material and bony overgrowth. RIGHT L4 and RIGHT L5 nerve root impingement are worsened by mild to moderate facet arthropathy.  L5-S1: Calcified annulus without frank protrusion. Mild osteophytic ridging extends into both foramina. L5 or S1 nerve root impingement is not established.  Examination of the SI joints reveals mild sclerosis not unexpected for age. There is no erosive change or fusion. There is no focal osseous lesion.  IMPRESSION: LUMBAR MYELOGRAM IMPRESSION:  Potentially symptomatic LEFT-sided neural impingement at L1-2 due to large disc extrusion, as well as degenerative scoliosis with asymmetric loss of interspace height at L2-3 and L3-4 both the LEFT. No dynamic instability although the stenosis at L1-2 is worse with patient upright, and the LEFT-sided defect at L3-4 is also worse with patient standing.  CT LUMBAR MYELOGRAM IMPRESSION:  Central and leftward disc extrusion at L1-2. Moderate stenosis at this level is worse on the LEFT.  Asymmetric subarticular zone and foraminal zone narrowing at L3-4 on the LEFT is multifactorial, related to disc protrusion, asymmetric loss of interspace height, and bony overgrowth. Moderate facet arthropathy is observed at this level as well.  Shallow central protrusion at L2-3 with marked leftward foraminal and extraforaminal spurring in asymmetric loss of interspace height on the LEFT. LEFT L2 nerve root impingement in the foramen is  possible.  No SI joint pathology of significance.   Electronically Signed   By: Staci Righter M.D.   On: 07/01/2015 13:21   Mr Brain Wo Contrast  07/16/2015   CLINICAL DATA:  Initial evaluation for acute confusion.  EXAM: MRI HEAD WITHOUT CONTRAST  TECHNIQUE: Multiplanar, multiecho pulse sequences of the brain and surrounding structures were obtained without intravenous contrast.  COMPARISON:  Prior CT from earlier the same day.  FINDINGS: Diffuse prominence of the CSF containing spaces is compatible with generalized age-related cerebral atrophy. Patchy and confluent T2/FLAIR hyperintensity within the periventricular and deep white matter noted, most consistent with chronic small vessel ischemic disease, fairly mild for patient age.  No abnormal foci of restricted diffusion to suggest acute intracranial infarct. Normal intravascular flow voids preserved. No acute or chronic intracranial hemorrhage.  No mass lesion, midline shift, or mass effect. No hydrocephalus. No extra-axial fluid collection.  Craniocervical junction within normal limits. Pituitary gland normal.  No acute abnormality about the orbits.  Scattered mucosal thickening present within the ethmoidal air cells. Paranasal sinuses are otherwise clear. No mastoid effusion. Inner ear structures within normal limits.  Bone marrow signal intensity within normal limits. Scalp soft tissues unremarkable.  IMPRESSION: 1. No acute intracranial infarct or other abnormality identified. 2. Generalized age-related cerebral atrophy with mild chronic small vessel ischemic disease.   Electronically Signed   By: Jeannine Boga M.D.   On: 07/16/2015 00:29   Dg Myelography Lumbar Inj Lumbosacral  07/01/2015   CLINICAL DATA:  Low back pain. LEFT hip pain. Symptoms developed after twisting injury.  EXAM: LUMBAR MYELOGRAM  FLUOROSCOPY TIME:  25 seconds corresponding to a Dose Area Product of 209 Gy*m2  PROCEDURE: After thorough discussion of risks and benefits of  the procedure including bleeding, infection, injury to nerves, blood vessels, adjacent structures as well as headache and CSF leak, written and oral informed consent was obtained. Consent was obtained by Dr. Rolla Flatten. Time out form was completed.  Patient was positioned prone on the fluoroscopy table. Local anesthesia was provided with 1% lidocaine without  epinephrine after prepped and draped in the usual sterile fashion. Puncture was performed at L5-S1 using a 3 1/2 inch 22-gauge spinal needle via midline approach. Using a single pass through the dura, the needle was placed within the thecal sac, with return of clear CSF. 15 mL of Omnipaque-180 was injected into the thecal sac, with normal opacification of the nerve roots and cauda equina consistent with free flow within the subarachnoid space.  I personally performed the lumbar puncture and administered the intrathecal contrast. I also personally supervised acquisition of the myelogram images.  TECHNIQUE: Contiguous axial images were obtained through the Lumbar spine after the intrathecal infusion of infusion. Coronal and sagittal reconstructions were obtained of the axial image sets.  COMPARISON:  None  FINDINGS: LUMBAR MYELOGRAM FINDINGS:  Good opacification lumbar subarachnoid space. Degenerative scoliosis convex RIGHT with asymmetric loss of interspace height at L2-3 and L3-4 on the LEFT and asymmetric loss of interspace height at L4-5 on the RIGHT. Myelographically, lateral recess stenosis is apparent at L2-3 and L3-4 on the LEFT with effacement of the LEFT L3 and LEFT L4 nerve roots respectively. These changes are worse with patient upright, particularly at L3-4.  There is a large ventral defect at L1-2 consistent with a central disc extrusion. This lies below the conus. Trace retrolisthesis at this level appears facet mediated. There is moderate stenosis at this level with effacement of the LEFT L2 nerve root.  Aside from L1-2, the alignment is anatomic.  There is no significant dynamic instability although the stenosis at L1-2 is worse with patient upright.  CT LUMBAR MYELOGRAM FINDINGS:  Segmentation: Normal.  Alignment:  Normal.  Vertebrae: No worrisome osseous lesion.  Conus medullaris: Normal in size, signal, and location.  Paraspinal tissues: No evidence for hydronephrosis or paravertebral mass. Moderate vascular calcification.  Disc levels:  L1-L2: Central and leftward disc extrusion. Trace retrolisthesis. Mild facet arthropathy. Slight effacement LEFT L2 nerve root. Mild central canal stenosis.  L2-L3: Shallow central protrusion. Marked leftward foraminal and extraforaminal spurring. Mild facet arthropathy. No subarticular zone narrowing of significance. LEFT L2 nerve root impingement in the foramen is possible however.  L3-L4: Central and leftward protrusion extends into the foramen. Asymmetric facet arthropathy also on the LEFT of moderate degree. Asymmetric loss of interspace height on the LEFT. LEFT L4 and LEFT L3 nerve root impingement are likely.  L4-L5: Moderate disc space narrowing is worse on the RIGHT. Asymmetric subarticular zone and foraminal zone narrowing due to a combination of disc material and bony overgrowth. RIGHT L4 and RIGHT L5 nerve root impingement are worsened by mild to moderate facet arthropathy.  L5-S1: Calcified annulus without frank protrusion. Mild osteophytic ridging extends into both foramina. L5 or S1 nerve root impingement is not established.  Examination of the SI joints reveals mild sclerosis not unexpected for age. There is no erosive change or fusion. There is no focal osseous lesion.  IMPRESSION: LUMBAR MYELOGRAM IMPRESSION:  Potentially symptomatic LEFT-sided neural impingement at L1-2 due to large disc extrusion, as well as degenerative scoliosis with asymmetric loss of interspace height at L2-3 and L3-4 both the LEFT. No dynamic instability although the stenosis at L1-2 is worse with patient upright, and the  LEFT-sided defect at L3-4 is also worse with patient standing.  CT LUMBAR MYELOGRAM IMPRESSION:  Central and leftward disc extrusion at L1-2. Moderate stenosis at this level is worse on the LEFT.  Asymmetric subarticular zone and foraminal zone narrowing at L3-4 on the LEFT is multifactorial, related to disc protrusion,  asymmetric loss of interspace height, and bony overgrowth. Moderate facet arthropathy is observed at this level as well.  Shallow central protrusion at L2-3 with marked leftward foraminal and extraforaminal spurring in asymmetric loss of interspace height on the LEFT. LEFT L2 nerve root impingement in the foramen is possible.  No SI joint pathology of significance.   Electronically Signed   By: Staci Righter M.D.   On: 07/01/2015 13:21    CBC  Recent Labs Lab 07/14/15 1631 07/15/15 2352 07/16/15 0500 07/16/15 0525 07/17/15 0524  WBC 6.6 6.3 5.4  --  4.8  HGB 11.1* 10.8* 10.5*  --  10.1*  HCT 31.5* 31.8* 31.1* 31.3* 30.5*  PLT 290 273 275  --  252  MCV 91.8 92.2 92.6  --  93.0  MCH 32.4 31.3 31.3  --  30.8  MCHC 35.2 34.0 33.8  --  33.1  RDW 12.9 13.1 13.1  --  13.0  LYMPHSABS 1.8 1.5  --   --   --   MONOABS 0.8 0.8  --   --   --   EOSABS 0.1 0.1  --   --   --   BASOSABS 0.0 0.0  --   --   --     Chemistries   Recent Labs Lab 07/14/15 1631 07/15/15 2352 07/16/15 0500 07/17/15 0524  NA 130* 134* 135 136  K 3.7 3.8 3.9 4.0  CL 95* 100* 101 104  CO2 27 26 26 26   GLUCOSE 111* 122* 96 106*  BUN 19 16 14 15   CREATININE 0.66 0.65 0.62 0.70  CALCIUM 10.3 9.9 10.1 10.1  AST 28 25 20   --   ALT 26 27 24   --   ALKPHOS 46 45 44  --   BILITOT 0.8 0.6 0.8  --    ------------------------------------------------------------------------------------------------------------------ estimated creatinine clearance is 41.1 mL/min (by C-G formula based on Cr of  0.7). ------------------------------------------------------------------------------------------------------------------ No results for input(s): HGBA1C in the last 72 hours. ------------------------------------------------------------------------------------------------------------------ No results for input(s): CHOL, HDL, LDLCALC, TRIG, CHOLHDL, LDLDIRECT in the last 72 hours. ------------------------------------------------------------------------------------------------------------------ No results for input(s): TSH, T4TOTAL, T3FREE, THYROIDAB in the last 72 hours.  Invalid input(s): FREET3 ------------------------------------------------------------------------------------------------------------------  Recent Labs  07/20/15 0554  VITAMINB12 846    Coagulation profile No results for input(s): INR, PROTIME in the last 168 hours.  No results for input(s): DDIMER in the last 72 hours.  Cardiac Enzymes  Recent Labs Lab 07/14/15 1631  TROPONINI <0.03   ------------------------------------------------------------------------------------------------------------------ Invalid input(s): [place]   Recent Labs  07/19/15 2137 07/20/15 0742 07/20/15 1149 07/20/15 1704 07/21/15 0118 07/21/15 0756  GLUCAP 124* 85 82 104* 98 95     Maryela Tapper M.D. [facility] 07/21/2015, 12:27 PM  Pager: [phone] Between 7am to 7pm - call Pager - [phone]  After 7pm go to [email or web address] - password [email or web address]  Call night coverage person covering after 7pm

## 2015-07-21 NOTE — Progress Notes (Signed)
Clinical Social Work  CSW met with patient at bedside with psych MD. Patient sitting in chair and eating lunch when psych team arrived. Patient reports she is feeling better but was tearful when discussing DC plans. Patient feels that she is being a burden on her family and does not want her son be stressed about her. Patient reports that she is feeling better and RN reports that patient's confusion has improved but she still has moments of confusion. Patient aware of her repetitive movement. Patient reports that she is trying to be more aware of actions and is trying to redirect herself. Patient has been knitting to keep her hands busy. Psych MD to monitor medications.  CSW will continue to follow.  Why, [place] [phone]

## 2015-07-21 NOTE — Consult Note (Signed)
[facility] Consult follow-up  Reason for Consult:  confusion and disorientation Referring Physician:  Dr. Izora Gala Patient Identification: Kelly Webster MRN:  [id number] Principal Diagnosis: Delirium Diagnosis:   Patient Active Problem List   Diagnosis Date Noted  . HTN (hypertension) [I10] 07/16/2015  . Diabetes mellitus without complication [W09.8] 11/91/4782  . Altered mental status [R41.82] 07/16/2015  . Altered mental state [R41.82] 07/16/2015  . Cognitive and behavioral changes [R41.89, F91.9]   . Delirium [R41.0]   . Breast cancer of upper-outer quadrant of right female breast [C50.411] 06/22/2013    Total Time spent with patient: 30 minutes  Subjective:   Kelly Webster is a 81 y.o. female patient admitted with recent onset of confusion and disorientation.  HPI:  Kelly Webster is a 81 y.o. 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 Patient reportedly has been confused and disoriented for the last 4 days. Patient reported she has been suffering with back pain over 4 months and her pain medication is not controlling the they want to do further testing in the hospital. Meanwhile patient was treated with Steroids and pain medication. Patient reportedly living by herself and able to manage her personal effects and financial budget until a few days ago.  Patient has few bizarre behaviors and also has some magical thinking as per her daughter and son-in-law who is at bedside. Because the nature of these symptoms which are short-term, may be delirium secondary to medication management at this time. Patient will be reevaluated further to see an improvement before she was discharged a the home or out of the home placement. Case discussed with psychiatric clinical social service and patient family. Patient has poor responses for questions related to current orientation. She is confused about a year, month, date and  name of the floor and could not subtract 7 series but able to calm down members from 10-1 and also names of the week back and forth. Patient has a good long-term memory, short-term memory fine immediately but 1 out of 3 delayed. Patient has fine language functions.  HPI Elements:   Location:  Confusion. Quality:  Fair to poor. Severity:  Some bizarre behaviors. Timing:  Chronic back pain and medication management. Duration:  4 days. Context:  Psychosocial stresses and chronic back pain and medication management. interval history:   Interval history: Patient seen today for psychiatric consultation follow-up with psychiatric social service. Patient appeared sitting in a chair next to her bed. Patient is awake, alert. Oriented to place, persons and situation. She reported that she slept about eight hours last night which makes her feels more calm and relaxed. Patient has less confusion and more anxious. Patient denied depression, mania and psychosis. Patient son is concerned about her aftercare /disposition plans as she is not able to care for herself and health insurance may not provide coverage for her rehab and out of home placement.   Past Medical History:  Past Medical History  Diagnosis Date  . Hypertension   . Diabetes mellitus without complication   . Hx of seasonal allergies   . Arthritis     gouty arthritis knees and hands    Past Surgical History  Procedure Laterality Date  . Pilonidal cyst excision      1957  . Appendectomy      1967  . Abdominal hysterectomy      1981  . Carpal tunnel release Left     1988  .  Breast lumpectomy with needle localization Right 07/27/2013    Procedure: BREAST LUMPECTOMY WITH NEEDLE LOCALIZATION;  Surgeon: Edward Jolly, MD;  Location: [facility];  Service: General;  Laterality: Right;   Family History:  Family History  Problem Relation Age of Onset  . Colon cancer Sister   . Colon cancer Maternal Uncle   . Lung cancer  Maternal Uncle   . Bone cancer Cousin    Social History:  History  Alcohol Use No     History  Drug Use No    Social History   Social History  . Marital Status: Widowed    Spouse Name: N/A  . Number of Children: N/A  . Years of Education: N/A   Social History Main Topics  . Smoking status: Former Smoker -- 0.25 packs/day    Types: Cigarettes    Quit date: 08/28/1980  . Smokeless tobacco: None  . Alcohol Use: No  . Drug Use: No  . Sexual Activity: Not Currently   Other Topics Concern  . None   Social History Narrative   Additional Social History:                          Allergies:   Allergies  Allergen Reactions  . Demerol [Meperidine] Nausea Only    Labs:  Results for orders placed or performed during the hospital encounter of 07/15/15 (from the past 48 hour(s))  Glucose, capillary     Status: Abnormal   Collection Time: 07/19/15  4:49 PM  Result Value Ref Range   Glucose-Capillary 118 (H) 65 - 99 mg/dL  Glucose, capillary     Status: Abnormal   Collection Time: 07/19/15  9:37 PM  Result Value Ref Range   Glucose-Capillary 124 (H) 65 - 99 mg/dL   Comment 1 Notify RN   Vitamin B12     Status: None   Collection Time: 07/20/15  5:54 AM  Result Value Ref Range   Vitamin B-12 846 180 - 914 pg/mL    Comment: (NOTE) This assay is not validated for testing neonatal or myeloproliferative syndrome specimens for Vitamin B12 levels. Performed at [facility]   RPR     Status: None   Collection Time: 07/20/15  5:54 AM  Result Value Ref Range   RPR Ser Ql Non Reactive Non Reactive    Comment: (NOTE) Performed At: [facility] [place], [place] [id number] Lindon Romp MD [id number]   HIV antibody     Status: None   Collection Time: 07/20/15  5:54 AM  Result Value Ref Range   HIV Screen 4th Generation wRfx Non Reactive Non Reactive    Comment: (NOTE) Performed At: [facility] [address]  [place], [place] [id number] Lindon Romp MD [id number]   Glucose, capillary     Status: None   Collection Time: 07/20/15  7:42 AM  Result Value Ref Range   Glucose-Capillary 85 65 - 99 mg/dL  Glucose, capillary     Status: None   Collection Time: 07/20/15 11:49 AM  Result Value Ref Range   Glucose-Capillary 82 65 - 99 mg/dL  Glucose, capillary     Status: Abnormal   Collection Time: 07/20/15  5:04 PM  Result Value Ref Range   Glucose-Capillary 104 (H) 65 - 99 mg/dL  Glucose, capillary     Status: None   Collection Time: 07/21/15  1:18 AM  Result Value Ref Range   Glucose-Capillary  98 65 - 99 mg/dL  Sedimentation rate     Status: Abnormal   Collection Time: 07/21/15  5:47 AM  Result Value Ref Range   Sed Rate 77 (H) 0 - 22 mm/hr  Cortisol     Status: None   Collection Time: 07/21/15  5:47 AM  Result Value Ref Range   Cortisol, Plasma 21.3 ug/dL    Comment: (NOTE) AM    6.7 - 22.6 ug/dL PM   <10.0       ug/dL Performed at [facility]   Ammonia     Status: Abnormal   Collection Time: 07/21/15  5:47 AM  Result Value Ref Range   Ammonia <9 (L) 9 - 35 umol/L  Glucose, capillary     Status: None   Collection Time: 07/21/15  7:56 AM  Result Value Ref Range   Glucose-Capillary 95 65 - 99 mg/dL  Glucose, capillary     Status: Abnormal   Collection Time: 07/21/15 11:56 AM  Result Value Ref Range   Glucose-Capillary 103 (H) 65 - 99 mg/dL    Vitals: Blood pressure 124/56, pulse 80, temperature 97.4 F (36.3 C), temperature source Oral, resp. rate 18, height 4\' 9"  (1.448 m), weight 60.1 kg (132 lb 7.9 oz), SpO2 100 %.  Risk to Self: Is patient at risk for suicide?: No Risk to Others:   Prior Inpatient Therapy:   Prior Outpatient Therapy:    Current Facility-Administered Medications  Medication Dose Route Frequency Provider Last Rate Last Dose  . acetaminophen (TYLENOL) tablet 650 mg  650 mg Oral Q6H PRN Allyne Gee, MD   650 mg at 07/20/15 2046   Or  .  acetaminophen (TYLENOL) suppository 650 mg  650 mg Rectal Q6H PRN Allyne Gee, MD      . allopurinol (ZYLOPRIM) tablet 300 mg  300 mg Oral Daily Nita Sells, MD   300 mg at 07/21/15 0914  . colchicine tablet 0.6 mg  0.6 mg Oral Daily Nita Sells, MD   0.6 mg at 07/21/15 0913  . folic acid (FOLVITE) tablet 1 mg  1 mg Oral Daily Allyne Gee, MD   1 mg at 07/21/15 0914  . heparin injection 5,000 Units  5,000 Units Subcutaneous 3 times per day Allyne Gee, MD   5,000 Units at 07/21/15 1420  . losartan (COZAAR) tablet 50 mg  50 mg Oral Daily Allyne Gee, MD   50 mg at 07/21/15 0981   And  . hydrochlorothiazide (MICROZIDE) capsule 12.5 mg  12.5 mg Oral Daily Allyne Gee, MD   12.5 mg at 07/21/15 0914  . insulin aspart (novoLOG) injection 0-15 Units  0-15 Units Subcutaneous TID WC Allyne Gee, MD   0 Units at 07/16/15 0830  . metFORMIN (GLUCOPHAGE) tablet 250 mg  250 mg Oral Q breakfast Allyne Gee, MD   250 mg at 07/21/15 1914  . multivitamin with minerals tablet 1 tablet  1 tablet Oral Daily Allyne Gee, MD   1 tablet at 07/21/15 0914  . QUEtiapine (SEROQUEL) tablet 25 mg  25 mg Oral QHS Ambrose Finland, MD   25 mg at 07/20/15 2222  . sodium chloride 0.9 % injection 3 mL  3 mL Intravenous Q12H Allyne Gee, MD   3 mL at 07/21/15 0914  . thiamine (VITAMIN B-1) tablet 100 mg  100 mg Oral Daily Allyne Gee, MD   100 mg at 07/21/15 7829    Musculoskeletal: Strength & Muscle  Tone: decreased Gait & Station: unable to stand Patient leans: N/A  Psychiatric Specialty Exam: Physical Exam  ROS.  Blood pressure 124/56, pulse 80, temperature 97.4 F (36.3 C), temperature source Oral, resp. rate 18, height 4\' 9"  (1.448 m), weight 60.1 kg (132 lb 7.9 oz), SpO2 100 %.Body mass index is 28.66 kg/(m^2).  General Appearance: Casual  Eye Contact::  Fair  Speech:  Clear and Coherent  Volume:  Normal  Mood:  Anxious  Affect:  Appropriate and Congruent  Thought Process:   Coherent and Goal Directed  Orientation:  Other:  knows her name, name of the hospital but confused about year, month and date  Thought Content:  Rumination  Suicidal Thoughts:  No  Homicidal Thoughts:  No  Memory:  Immediate;   Fair Recent;   Poor  Judgement:  Fair  Insight:  Shallow  Psychomotor Activity:  Decreased  Concentration:  Fair  Recall:  [place] of Knowledge:Good  Language: Good  Akathisia:  Negative  Handed:  Right  AIMS (if indicated):     Assets:  Communication Skills Desire for Improvement Financial Resources/Insurance Housing Intimacy Resilience Social Support Transportation  ADL's:  Impaired  Cognition: Impaired,  Mild  Sleep:      Medical Decision Making: New problem, with additional work up planned, Review of Psycho-Social Stressors (1), Discuss test with performing physician (1), Decision to obtain old records (1), Review of Last Therapy Session (1), Review or order medicine tests (1), Review of Medication Regimen & Side Effects (2) and Review of New Medication or Change in Dosage (2)  Treatment Plan Summary: Patient delirium has been slowly improving probably secondary to current medications like steroids and opioids. Patient benefit from out-of-home placement as she cannot care for herself and family was not able to support her at this time. Daily contact with patient to assess and evaluate symptoms and progress in treatment and Medication management  Plan: Case discussed with psychiatric social service Continue Seroquel 20 mg Qhs and may repeat in half hour if needed for insomnia May use mild dose of clonazepam PRN for anxiety.  Patient does not meet criteria for psychiatric inpatient admission. Supportive therapy provided about ongoing stressors.  Appreciate psychiatric consultation and will sign off at this time Please contact [phone] or [phone] if needs further assistance   Disposition: Patient may benefit from the out-of-home placement for  short period when medically stable. Will refer to the outpatient medication management when medically stable.  Amadou Katzenstein,JANARDHAHA R. 07/21/2015 2:29 PM

## 2015-07-21 NOTE — Progress Notes (Signed)
Physical Therapy Treatment Patient Details Name: Kelly Webster MRN: [id number] DOB: 09-21-1931 Today's Date: 08-17-15    History of Present Illness 81 y.o. female with a past history of DM, HTN, invasive cancer of the right breast, status post right breast lumpectomy and presents with altered mental status. She had been on prednisone for the last week and also had been on oxycodone for back pain.  Pt admitted for toxic metabolic encephalopathy with superimposed delirum from meds    PT Comments    Pt performing mobility well and only requiring cues for cognitive tasks, such as washing hands, used soap x4.  Follow Up Recommendations  No PT follow up;Supervision for mobility/OOB     Equipment Recommendations  None recommended by PT    Recommendations for Other Services       Precautions / Restrictions Precautions Precautions: Fall    Mobility  Bed Mobility               General bed mobility comments: pt up in recliner on arrival  Transfers Overall transfer level: Needs assistance Equipment used: None Transfers: Sit to/from Stand Sit to Stand: Supervision         General transfer comment: verbal cues for safety  Ambulation/Gait Ambulation/Gait assistance: Supervision Ambulation Distance (Feet): 280 Feet Assistive device: None Gait Pattern/deviations: Step-through pattern;Decreased stride length     General Gait Details: continue to have slow but steady pace and did not use any UE support today,  no LOB or unsteadiness observed   Stairs            Wheelchair Mobility    Modified Rankin (Stroke Patients Only)       Balance                                    Cognition Arousal/Alertness: Awake/alert Behavior During Therapy: WFL for tasks assessed/performed;Restless Overall Cognitive Status: Impaired/Different from baseline Area of Impairment: Memory               General Comments: restless with UEs, used soap in  bathroom x4 and unable to explain reasoning    Exercises      General Comments        Pertinent Vitals/Pain Pain Assessment: No/denies pain    Home Living                      Prior Function            PT Goals (current goals can now be found in the care plan section) Progress towards PT goals: Progressing toward goals    Frequency  Min 2X/week    PT Plan Current plan remains appropriate    Co-evaluation             End of Session   Activity Tolerance: Patient tolerated treatment well Patient left: in chair;with call bell/phone within reach;with chair alarm set     Time: 0347-4259 PT Time Calculation (min) (ACUTE ONLY): 12 min  Charges:  $Gait Training: 8-22 mins                    G Codes:      Kelly Webster,Kelly Webster 08-17-2015, 1:26 PM Carmelia Bake, PT, DPT August 17, 2015 Pager: [phone]

## 2015-07-22 LAB — GLUCOSE, CAPILLARY
GLUCOSE-CAPILLARY: 97 mg/dL (ref 65–99)
GLUCOSE-CAPILLARY: 92 mg/dL (ref 65–99)
Glucose-Capillary: 93 mg/dL (ref 65–99)
Glucose-Capillary: 105 mg/dL — ABNORMAL HIGH (ref 65–99)

## 2015-07-22 NOTE — Progress Notes (Signed)
[facility]                                                                              Patient Demographics  Kelly Webster, is a 81 y.o. female, DOB - 1931/06/28, OIZ:[id number]  Admit date - 07/15/2015   Admitting Physician Allyne Gee, MD  Outpatient Primary MD for the patient is Donnajean Lopes, MD  LOS - 5   Chief Complaint  Patient presents with  . Altered Mental Status    change noticed on Sunday. Pt seen yesterday for same thing.       Brief HPI   Patient is 81 year old female withstage IA ER PR positive invasive cancer of the right breast, status post right breast lumpectomy, diabetes, hypertension had originally presented to ED with altered mental status. Apparently patient was having back issues since the beginning of July and was being seen on an outpatient basis and was started on oxycodone and steroids. Patient also had at least one cortisone injection. Patient had been living alone. A weekend prior to admission, she was found by her son at the store confused and subsequently more cognitive issues including dialing wrong phone numbers and confused. Patient was seen in ED on 8/15 and head CT was normal. However for the next 3-4 days, patient had waxing and waning improvement. Patient was brought back to the ED due to no significant improvement in the mental status per the family.   Assessment & Plan    Principal Problem:   Delirium: Improving, likely prolonged side effect of steroids and narcotics, close to baseline  - So far workup unrevealing, psychiatry consulted, started on Seroquel daily - Neurology recommendations appreciated, MRI negative for any CVA - B12, folate, HIV, RPR, cortisol, ammonia level negative - Patient improving, ambulating without any difficulty with physical therapy, does not qualify for skilled nursing facility.  - Had a long discussion with patient's son who strongly feels that she should be in a skilled nursing facility  at least for short-term, he feels that patient needs 24/ 7 care. I explained to the patient's son, Kelly Webster that patient needs 24/7 supervision but does not have any skilled nursing needs at this time. He also wants to discuss with Education officer, museum.  -  I highly appreciate Dr. Buel Ream visit today, discussed in detail, he will call patient's son, Kelly Webster. Hopefully we can have safe disposition for the patient.  Active Problems:   HTN (hypertension) Currently stable, continue losartan HCTZ    Diabetes mellitus without complication - Continue metformin  CA breast status post lumpectomy Stable  Code Status: Full code  Family Communication: Discussed in detail with the patient, all imaging results, lab results explained to the patient and son on the phone    Disposition Plan: Hopefully DC today or in a.m.  Time Spent in minutes  26minutes  Procedures  MRI brain  Consults   Neurology Psychiatry  DVT Prophylaxis SCD  Medications  Scheduled Meds: . allopurinol  300 mg Oral Daily  . colchicine  0.6 mg Oral Daily  . folic acid  1 mg Oral Daily  . heparin  5,000 Units Subcutaneous 3 times per  day  . losartan  50 mg Oral Daily   And  . hydrochlorothiazide  12.5 mg Oral Daily  . insulin aspart  0-15 Units Subcutaneous TID WC  . metFORMIN  250 mg Oral Q breakfast  . multivitamin with minerals  1 tablet Oral Daily  . QUEtiapine  25 mg Oral QHS  . sodium chloride  3 mL Intravenous Q12H  . thiamine  100 mg Oral Daily   Continuous Infusions:  PRN Meds:.acetaminophen **OR** acetaminophen   Antibiotics   Anti-infectives    None        Subjective:   Kelly Webster was seen and examined today. Close to baseline mental status. Patient denies dizziness, chest pain, shortness of breath, abdominal pain, N/V/D/C, new weakness, numbess, tingling. No acute events overnight.    Objective:   Blood pressure 133/53, pulse 86, temperature 97.7 F (36.5 C), temperature source Oral,  resp. rate 18, height 4\' 9"  (1.448 m), weight 60.1 kg (132 lb 7.9 oz), SpO2 98 %.  Wt Readings from Last 3 Encounters:  07/21/15 60.1 kg (132 lb 7.9 oz)  07/15/15 69.854 kg (154 lb)  04/15/14 69.854 kg (154 lb)     Intake/Output Summary (Last 24 hours) at 07/22/15 1119 Last data filed at 07/22/15 0903  Gross per 24 hour  Intake      6 ml  Output      0 ml  Net      6 ml    Exam  General: Alert and oriented x 3, NAD, occasionally repeats herself and tangential however significantly improving  HEENT:  PERRLA, EOMI, Anicteric Sclera, mucous membranes moist.   Neck: Supple, no JVD, no masses  CVS: S1 S2 clear, RRR  Respiratory: CTAB  Abdomen: Soft, nontender, nondistended, + bowel sounds  Ext: no cyanosis clubbing or edema  Neuro: AAOx3, Cr N's II- XII. Strength 5/5 upper and lower extremities bilaterally  Skin: No rashes  Psych: Normal affect and demeanor, alert and oriented x3    Data Review   Micro Results Recent Results (from the past 240 hour(s))  Urine culture     Status: None   Collection Time: 07/14/15  4:17 PM  Result Value Ref Range Status   Specimen Description URINE, CLEAN CATCH  Final   Special Requests NONE  Final   Culture   Final    MULTIPLE SPECIES PRESENT, SUGGEST RECOLLECTION Performed at [facility]    Report Status 07/16/2015 FINAL  Final    Radiology Reports Dg Chest 2 View  07/14/2015   CLINICAL DATA:  Confusion  EXAM: CHEST  2 VIEW  COMPARISON:  None.  FINDINGS: Lungs are clear.  No pleural effusion or pneumothorax.  The heart is normal in size.  Degenerative changes of the visualized thoracolumbar spine.  IMPRESSION: No evidence of acute cardiopulmonary disease.   Electronically Signed   By: Julian Hy M.D.   On: 07/14/2015 17:32   Ct Head Wo Contrast  07/14/2015   CLINICAL DATA:  Per pt family, pt was recently prescribed prednisone (5mg ), and oxycodone hcl (5 mg) for back pain. Pt became confused on Sat 8/13 at a store  and family had to pick her up. Per family pt, has been having episodes of confusion and clarity since last week  EXAM: CT HEAD WITHOUT CONTRAST  TECHNIQUE: Contiguous axial images were obtained from the base of the skull through the vertex without intravenous contrast.  COMPARISON:  None.  FINDINGS: The ventricles are normal in configuration. There is age  related ventricular and sulcal enlargement. No hydrocephalus.  There are no parenchymal masses or mass effect. There is no evidence of a cortical infarct. Mild white matter hypoattenuation is noted consistent with chronic microvascular ischemic change.  There are no extra-axial masses or abnormal fluid collections.  There is no intracranial hemorrhage.  Single opacified posterior right ethmoid air cell. Minor dependent mucosal thickening in the left sphenoid sinus. Remaining visualized sinuses are clear as are the mastoid air cells. No skull lesion.  IMPRESSION: 1. No acute intracranial abnormalities. 2. Age related volume loss. Mild chronic microvascular ischemic change.   Electronically Signed   By: Lajean Manes M.D.   On: 07/14/2015 16:57   Ct Lumbar Spine W Contrast  07/01/2015   CLINICAL DATA:  Low back pain. LEFT hip pain. Symptoms developed after twisting injury.  EXAM: LUMBAR MYELOGRAM  FLUOROSCOPY TIME:  25 seconds corresponding to a Dose Area Product of 209 Gy*m2  PROCEDURE: After thorough discussion of risks and benefits of the procedure including bleeding, infection, injury to nerves, blood vessels, adjacent structures as well as headache and CSF leak, written and oral informed consent was obtained. Consent was obtained by Dr. Rolla Flatten. Time out form was completed.  Patient was positioned prone on the fluoroscopy table. Local anesthesia was provided with 1% lidocaine without epinephrine after prepped and draped in the usual sterile fashion. Puncture was performed at L5-S1 using a 3 1/2 inch 22-gauge spinal needle via midline approach. Using a  single pass through the dura, the needle was placed within the thecal sac, with return of clear CSF. 15 mL of Omnipaque-180 was injected into the thecal sac, with normal opacification of the nerve roots and cauda equina consistent with free flow within the subarachnoid space.  I personally performed the lumbar puncture and administered the intrathecal contrast. I also personally supervised acquisition of the myelogram images.  TECHNIQUE: Contiguous axial images were obtained through the Lumbar spine after the intrathecal infusion of infusion. Coronal and sagittal reconstructions were obtained of the axial image sets.  COMPARISON:  None  FINDINGS: LUMBAR MYELOGRAM FINDINGS:  Good opacification lumbar subarachnoid space. Degenerative scoliosis convex RIGHT with asymmetric loss of interspace height at L2-3 and L3-4 on the LEFT and asymmetric loss of interspace height at L4-5 on the RIGHT. Myelographically, lateral recess stenosis is apparent at L2-3 and L3-4 on the LEFT with effacement of the LEFT L3 and LEFT L4 nerve roots respectively. These changes are worse with patient upright, particularly at L3-4.  There is a large ventral defect at L1-2 consistent with a central disc extrusion. This lies below the conus. Trace retrolisthesis at this level appears facet mediated. There is moderate stenosis at this level with effacement of the LEFT L2 nerve root.  Aside from L1-2, the alignment is anatomic. There is no significant dynamic instability although the stenosis at L1-2 is worse with patient upright.  CT LUMBAR MYELOGRAM FINDINGS:  Segmentation: Normal.  Alignment:  Normal.  Vertebrae: No worrisome osseous lesion.  Conus medullaris: Normal in size, signal, and location.  Paraspinal tissues: No evidence for hydronephrosis or paravertebral mass. Moderate vascular calcification.  Disc levels:  L1-L2: Central and leftward disc extrusion. Trace retrolisthesis. Mild facet arthropathy. Slight effacement LEFT L2 nerve root.  Mild central canal stenosis.  L2-L3: Shallow central protrusion. Marked leftward foraminal and extraforaminal spurring. Mild facet arthropathy. No subarticular zone narrowing of significance. LEFT L2 nerve root impingement in the foramen is possible however.  L3-L4: Central and leftward protrusion extends into the  foramen. Asymmetric facet arthropathy also on the LEFT of moderate degree. Asymmetric loss of interspace height on the LEFT. LEFT L4 and LEFT L3 nerve root impingement are likely.  L4-L5: Moderate disc space narrowing is worse on the RIGHT. Asymmetric subarticular zone and foraminal zone narrowing due to a combination of disc material and bony overgrowth. RIGHT L4 and RIGHT L5 nerve root impingement are worsened by mild to moderate facet arthropathy.  L5-S1: Calcified annulus without frank protrusion. Mild osteophytic ridging extends into both foramina. L5 or S1 nerve root impingement is not established.  Examination of the SI joints reveals mild sclerosis not unexpected for age. There is no erosive change or fusion. There is no focal osseous lesion.  IMPRESSION: LUMBAR MYELOGRAM IMPRESSION:  Potentially symptomatic LEFT-sided neural impingement at L1-2 due to large disc extrusion, as well as degenerative scoliosis with asymmetric loss of interspace height at L2-3 and L3-4 both the LEFT. No dynamic instability although the stenosis at L1-2 is worse with patient upright, and the LEFT-sided defect at L3-4 is also worse with patient standing.  CT LUMBAR MYELOGRAM IMPRESSION:  Central and leftward disc extrusion at L1-2. Moderate stenosis at this level is worse on the LEFT.  Asymmetric subarticular zone and foraminal zone narrowing at L3-4 on the LEFT is multifactorial, related to disc protrusion, asymmetric loss of interspace height, and bony overgrowth. Moderate facet arthropathy is observed at this level as well.  Shallow central protrusion at L2-3 with marked leftward foraminal and extraforaminal spurring  in asymmetric loss of interspace height on the LEFT. LEFT L2 nerve root impingement in the foramen is possible.  No SI joint pathology of significance.   Electronically Signed   By: Staci Righter M.D.   On: 07/01/2015 13:21   Mr Brain Wo Contrast  07/16/2015   CLINICAL DATA:  Initial evaluation for acute confusion.  EXAM: MRI HEAD WITHOUT CONTRAST  TECHNIQUE: Multiplanar, multiecho pulse sequences of the brain and surrounding structures were obtained without intravenous contrast.  COMPARISON:  Prior CT from earlier the same day.  FINDINGS: Diffuse prominence of the CSF containing spaces is compatible with generalized age-related cerebral atrophy. Patchy and confluent T2/FLAIR hyperintensity within the periventricular and deep white matter noted, most consistent with chronic small vessel ischemic disease, fairly mild for patient age.  No abnormal foci of restricted diffusion to suggest acute intracranial infarct. Normal intravascular flow voids preserved. No acute or chronic intracranial hemorrhage.  No mass lesion, midline shift, or mass effect. No hydrocephalus. No extra-axial fluid collection.  Craniocervical junction within normal limits. Pituitary gland normal.  No acute abnormality about the orbits.  Scattered mucosal thickening present within the ethmoidal air cells. Paranasal sinuses are otherwise clear. No mastoid effusion. Inner ear structures within normal limits.  Bone marrow signal intensity within normal limits. Scalp soft tissues unremarkable.  IMPRESSION: 1. No acute intracranial infarct or other abnormality identified. 2. Generalized age-related cerebral atrophy with mild chronic small vessel ischemic disease.   Electronically Signed   By: Jeannine Boga M.D.   On: 07/16/2015 00:29   Dg Myelography Lumbar Inj Lumbosacral  07/01/2015   CLINICAL DATA:  Low back pain. LEFT hip pain. Symptoms developed after twisting injury.  EXAM: LUMBAR MYELOGRAM  FLUOROSCOPY TIME:  25 seconds corresponding  to a Dose Area Product of 209 Gy*m2  PROCEDURE: After thorough discussion of risks and benefits of the procedure including bleeding, infection, injury to nerves, blood vessels, adjacent structures as well as headache and CSF leak, written and oral informed consent  was obtained. Consent was obtained by Dr. Rolla Flatten. Time out form was completed.  Patient was positioned prone on the fluoroscopy table. Local anesthesia was provided with 1% lidocaine without epinephrine after prepped and draped in the usual sterile fashion. Puncture was performed at L5-S1 using a 3 1/2 inch 22-gauge spinal needle via midline approach. Using a single pass through the dura, the needle was placed within the thecal sac, with return of clear CSF. 15 mL of Omnipaque-180 was injected into the thecal sac, with normal opacification of the nerve roots and cauda equina consistent with free flow within the subarachnoid space.  I personally performed the lumbar puncture and administered the intrathecal contrast. I also personally supervised acquisition of the myelogram images.  TECHNIQUE: Contiguous axial images were obtained through the Lumbar spine after the intrathecal infusion of infusion. Coronal and sagittal reconstructions were obtained of the axial image sets.  COMPARISON:  None  FINDINGS: LUMBAR MYELOGRAM FINDINGS:  Good opacification lumbar subarachnoid space. Degenerative scoliosis convex RIGHT with asymmetric loss of interspace height at L2-3 and L3-4 on the LEFT and asymmetric loss of interspace height at L4-5 on the RIGHT. Myelographically, lateral recess stenosis is apparent at L2-3 and L3-4 on the LEFT with effacement of the LEFT L3 and LEFT L4 nerve roots respectively. These changes are worse with patient upright, particularly at L3-4.  There is a large ventral defect at L1-2 consistent with a central disc extrusion. This lies below the conus. Trace retrolisthesis at this level appears facet mediated. There is moderate stenosis  at this level with effacement of the LEFT L2 nerve root.  Aside from L1-2, the alignment is anatomic. There is no significant dynamic instability although the stenosis at L1-2 is worse with patient upright.  CT LUMBAR MYELOGRAM FINDINGS:  Segmentation: Normal.  Alignment:  Normal.  Vertebrae: No worrisome osseous lesion.  Conus medullaris: Normal in size, signal, and location.  Paraspinal tissues: No evidence for hydronephrosis or paravertebral mass. Moderate vascular calcification.  Disc levels:  L1-L2: Central and leftward disc extrusion. Trace retrolisthesis. Mild facet arthropathy. Slight effacement LEFT L2 nerve root. Mild central canal stenosis.  L2-L3: Shallow central protrusion. Marked leftward foraminal and extraforaminal spurring. Mild facet arthropathy. No subarticular zone narrowing of significance. LEFT L2 nerve root impingement in the foramen is possible however.  L3-L4: Central and leftward protrusion extends into the foramen. Asymmetric facet arthropathy also on the LEFT of moderate degree. Asymmetric loss of interspace height on the LEFT. LEFT L4 and LEFT L3 nerve root impingement are likely.  L4-L5: Moderate disc space narrowing is worse on the RIGHT. Asymmetric subarticular zone and foraminal zone narrowing due to a combination of disc material and bony overgrowth. RIGHT L4 and RIGHT L5 nerve root impingement are worsened by mild to moderate facet arthropathy.  L5-S1: Calcified annulus without frank protrusion. Mild osteophytic ridging extends into both foramina. L5 or S1 nerve root impingement is not established.  Examination of the SI joints reveals mild sclerosis not unexpected for age. There is no erosive change or fusion. There is no focal osseous lesion.  IMPRESSION: LUMBAR MYELOGRAM IMPRESSION:  Potentially symptomatic LEFT-sided neural impingement at L1-2 due to large disc extrusion, as well as degenerative scoliosis with asymmetric loss of interspace height at L2-3 and L3-4 both the LEFT.  No dynamic instability although the stenosis at L1-2 is worse with patient upright, and the LEFT-sided defect at L3-4 is also worse with patient standing.  CT LUMBAR MYELOGRAM IMPRESSION:  Central and leftward disc extrusion  at L1-2. Moderate stenosis at this level is worse on the LEFT.  Asymmetric subarticular zone and foraminal zone narrowing at L3-4 on the LEFT is multifactorial, related to disc protrusion, asymmetric loss of interspace height, and bony overgrowth. Moderate facet arthropathy is observed at this level as well.  Shallow central protrusion at L2-3 with marked leftward foraminal and extraforaminal spurring in asymmetric loss of interspace height on the LEFT. LEFT L2 nerve root impingement in the foramen is possible.  No SI joint pathology of significance.   Electronically Signed   By: Staci Righter M.D.   On: 07/01/2015 13:21    CBC  Recent Labs Lab 07/15/15 2352 07/16/15 0500 07/16/15 0525 07/17/15 0524  WBC 6.3 5.4  --  4.8  HGB 10.8* 10.5*  --  10.1*  HCT 31.8* 31.1* 31.3* 30.5*  PLT 273 275  --  252  MCV 92.2 92.6  --  93.0  MCH 31.3 31.3  --  30.8  MCHC 34.0 33.8  --  33.1  RDW 13.1 13.1  --  13.0  LYMPHSABS 1.5  --   --   --   MONOABS 0.8  --   --   --   EOSABS 0.1  --   --   --   BASOSABS 0.0  --   --   --     Chemistries   Recent Labs Lab 07/15/15 2352 07/16/15 0500 07/17/15 0524  NA 134* 135 136  K 3.8 3.9 4.0  CL 100* 101 104  CO2 26 26 26   GLUCOSE 122* 96 106*  BUN 16 14 15   CREATININE 0.65 0.62 0.70  CALCIUM 9.9 10.1 10.1  AST 25 20  --   ALT 27 24  --   ALKPHOS 45 44  --   BILITOT 0.6 0.8  --    ------------------------------------------------------------------------------------------------------------------ estimated creatinine clearance is 41.1 mL/min (by C-G formula based on Cr of 0.7). ------------------------------------------------------------------------------------------------------------------ No results for input(s): HGBA1C in the  last 72 hours. ------------------------------------------------------------------------------------------------------------------ No results for input(s): CHOL, HDL, LDLCALC, TRIG, CHOLHDL, LDLDIRECT in the last 72 hours. ------------------------------------------------------------------------------------------------------------------ No results for input(s): TSH, T4TOTAL, T3FREE, THYROIDAB in the last 72 hours.  Invalid input(s): FREET3 ------------------------------------------------------------------------------------------------------------------  Recent Labs  07/20/15 0554  VITAMINB12 846    Coagulation profile No results for input(s): INR, PROTIME in the last 168 hours.  No results for input(s): DDIMER in the last 72 hours.  Cardiac Enzymes No results for input(s): CKMB, TROPONINI, MYOGLOBIN in the last 168 hours.  Invalid input(s): CK ------------------------------------------------------------------------------------------------------------------ Invalid input(s): [place]  07/21/15 0118 07/21/15 0756 07/21/15 1156 07/21/15 1629 07/21/15 2222 07/22/15 0731  GLUCAP 98 95 103* 95 100* 97     Kelly Webster M.D. [facility] 07/22/2015, 11:19 AM  Pager: [phone] Between 7am to 7pm - call Pager - [phone]  After 7pm go to [email or web address] - password [email or web address]  Call night coverage person covering after 7pm

## 2015-07-22 NOTE — Progress Notes (Signed)
Stopped by to see Kelly Webster today. I serve as her primary care physician and appreciate the work that the hospitalist service and consultants have done for her. She has a resolving altered mental status likely from corticosteroids versus pain meds. Today she is nearly at her baseline, was oriented x 3, showing no neurologic deficits. She does not appear to have skilled nursing needs at this time. A good plan for her could be to stay with her sister, Staci Acosta, for a short period of time, until she is fully back to her baseline, then likely return home. Her chronic back pain can be further treated as an outpatient. I will discuss my opinions with [place] and with her son today. Again, thanks for the excellent care that is being provided to her.

## 2015-07-22 NOTE — Care Management Important Message (Signed)
Important Message  Patient Details  Name: Kelly Webster MRN: [id number] Date of Birth: 05-Feb-1931   Medicare Important Message Given:  [facility] notification given    Camillo Flaming 07/22/2015, 11:59 AMImportant Message  Patient Details  Name: Kelly Webster MRN: [id number] Date of Birth: [birth year]   Medicare Important Message Given:  Yes-second notification given    Camillo Flaming 07/22/2015, 11:59 AM

## 2015-07-22 NOTE — Evaluation (Signed)
Occupational Therapy Evaluation Patient Details Name: Kelly Webster MRN: [id number] DOB: 08/12/1931 Today's Date: 07/22/2015    History of Present Illness 81 y.o. female with a past history of DM, HTN, invasive cancer of the right breast, status post right breast lumpectomy and presents with altered mental status. She had been on prednisone for the last week and also had been on oxycodone for back pain.  Pt admitted for toxic metabolic encephalopathy with superimposed delirum from meds   Clinical Impression   Pt will require 24/7 supervision for safety at d/c. She would be appropriate for an ALF setting. She is having difficulty recalling information, relating events in time, and is currently performing some tasks in a manner that doesn't appear logical. Will follow on acute to progress ADL independence and safety.     Follow Up Recommendations  Supervision/Assistance - 24 hour;No OT follow up    Equipment Recommendations  None recommended by OT    Recommendations for Other Services       Precautions / Restrictions Precautions Precautions: Fall      Mobility Bed Mobility Overal bed mobility: Needs Assistance Bed Mobility: Sit to Supine       Sit to supine: Supervision      Transfers Overall transfer level: Needs assistance Equipment used: None Transfers: Sit to/from Stand Sit to Stand: Supervision         General transfer comment: cues for safety.    Balance     Sitting balance-Leahy Scale: Good       Standing balance-Leahy Scale: Good                              ADL Overall ADL's : Needs assistance/impaired Eating/Feeding: Independent;Sitting   Grooming: Wash/dry hands;Supervision/safety;Standing   Upper Body Bathing: Set up;Sitting   Lower Body Bathing: Supervison/ safety;Sit to/from stand   Upper Body Dressing : Set up;Sitting   Lower Body Dressing: Supervision/safety;Sit to/from stand   Toilet Transfer: Min  guard;Ambulation;Comfort height toilet   Toileting- Clothing Manipulation and Hygiene: Supervision/safety;Sit to/from stand         General ADL Comments: Pt up to the bathroom to void. She poured cleanser on washcloth already lying in sink but then laid washcloth up on sink edge and didnt use washcloth at all. Pt confused with current year and also having difficulty with calculating that her birthday is 2 days from now yet knew current date and her birthdate. Pt laid a book in the middle of her bed but then got back in chair rather than putting book on her bedside table. Pt states her family has been having to help her recall information recently as she is having difficulty recalling sequence of events.      Vision     Perception     Praxis      Pertinent Vitals/Pain Pain Assessment: Faces Faces Pain Scale: Hurts a little bit Pain Location: low back Pain Descriptors / Indicators: Other (Comment);Discomfort (states nagging pain.) Pain Intervention(s): Repositioned     Hand Dominance     Extremity/Trunk Assessment Upper Extremity Assessment Upper Extremity Assessment: Overall WFL for tasks assessed           Communication Communication Communication: No difficulties   Cognition Arousal/Alertness: Awake/alert Behavior During Therapy: WFL for tasks assessed/performed Overall Cognitive Status: Impaired/Different from baseline Area of Impairment: Memory;Orientation Orientation Level: Time;Situation             General Comments: Pt opened  bottle of cleanser and poured over washcloth in sink and then placed washcloth up on sink edge. She didnt use washcloth. Pt describes her thinking as fuzzy. She stated 2013 as the year and had difficulty calculating that her birthday is 2 days from now but knew date and birthday.    General Comments       Exercises       Shoulder Instructions      Home Living Family/patient expects to be discharged to:: Private residence Living  Arrangements: Alone Available Help at Discharge: Family;Available PRN/intermittently Type of Home: House       Home Layout: One level     Bathroom Shower/Tub: Teacher, early years/pre: Handicapped height     Home Equipment: Cane - single point          Prior Functioning/Environment Level of Independence: Independent with assistive device(s)        Comments: occasionally uses SPC, likely also furniture walker. pt states she does own laundry and cooking. she states she has a vanity next to commode to help stand up.    OT Diagnosis: Generalized weakness   OT Problem List: Decreased strength;Decreased cognition   OT Treatment/Interventions: Self-care/ADL training;Patient/family education;Cognitive remediation/compensation    OT Goals(Current goals can be found in the care plan section) Acute Rehab OT Goals Patient Stated Goal: wants to return home. OT Goal Formulation: With patient Time For Goal Achievement: 08/05/15 Potential to Achieve Goals: Good  OT Frequency: Min 2X/week   Barriers to D/C:            Co-evaluation              End of Session    Activity Tolerance: Patient tolerated treatment well Patient left: in bed;with call bell/phone within reach;with bed alarm set   Time: 0940-1000 OT Time Calculation (min): 20 min Charges:  OT General Charges $OT Visit: 1 Procedure OT Evaluation $Initial OT Evaluation Tier I: 1 Procedure G-Codes:    Jules Schick  [phone] 07/22/2015, 10:11 AM

## 2015-07-23 LAB — GLUCOSE, CAPILLARY
GLUCOSE-CAPILLARY: 133 mg/dL — AB (ref 65–99)
GLUCOSE-CAPILLARY: 141 mg/dL — AB (ref 65–99)
Glucose-Capillary: 99 mg/dL (ref 65–99)
Glucose-Capillary: 90 mg/dL (ref 65–99)

## 2015-07-23 MED ORDER — TRAMADOL HCL 50 MG PO TABS: 50.0000 mg | ORAL_TABLET | Freq: Two times a day (BID) | ORAL | Status: DC | PRN

## 2015-07-23 MED ORDER — TRAMADOL HCL 50 MG PO TABS: 50.0000 mg | ORAL_TABLET | Freq: Three times a day (TID) | ORAL | Status: AC | PRN

## 2015-07-23 NOTE — Plan of Care (Signed)
Discharge was done today however subsequently, patient appealed the discharge "for second opinion". Discussed in detail with patient and her son at the bedside, answered all the concerns and questions. Patient requested for orthopedics to see her today as she does not want to go through 'the traumatic experience' for the last 1 week, again. I called Dr. Charlestine Night office, patient declined the 4 PM appointment in the office today and requested to be seen by orthopedics inpatient for the chronic back pain. Per RN, the mild back pain was relieved with Tylenol. I requested for orthopedics consult and also scheduled follow-up appointment on 07/28/15. RN, case manager aware.   RAI,RIPUDEEP M.D. [facility] 07/23/2015, 5:03 PM  Pager: [phone]

## 2015-07-23 NOTE — Care Management Note (Signed)
Case Management Note  Patient Details  Name: Kelly Webster MRN: [id number] Date of Birth: [birth year]  Subjective/Objective:  Spoke to patient/son(Dan) in rm about d/c plans.Patient/son want to pursue medicare appeal process.Informed Dr. Tana Coast who spoke to patient/son.Per Dr. Tana Coast addressed all concerns, patient is medically stable for d/c.Informed them of medicare appeal process,tel#, form signed, copy given to patient,& copy in patient's shadow chart.Patient/son voiced understanding.Also provided w/HHc agency list, & private duty care list(out of pocket expense)resource.Patient/son very appreciative of CM's assistance.                Action/Plan:await outcome of appeal.   Expected Discharge Date:                  Expected Discharge Plan:  [place]  In-House Referral:     Discharge planning Services  CM Consult  Post Acute Care Choice:    Choice offered to:  Patient  DME Arranged:    DME Agency:     HH Arranged:    [place] Agency:     Status of Service:  Completed, signed off  Medicare Important Message Given:  Yes-second notification given Date Medicare IM Given:    Medicare IM give by:    Date Additional Medicare IM Given:    Additional Medicare Important Message give by:     If discussed at [place] of Stay Meetings, dates discussed:    Additional Comments:  Dessa Phi, RN 07/23/2015, 12:35 PM

## 2015-07-23 NOTE — Care Management Note (Signed)
Case Management Note  Patient Details  Name: Kelly Webster MRN: [id number] Date of Birth: 05/02/1931  Subjective/Objective:   Spoke to son on phone about the detailed discharge notice per patient request for signature to send to medicare for appeal process.Patient/son voiced understanding & patient signed, copy given, & copy in shadow chart.                 Action/Plan:   Expected Discharge Date:                  Expected Discharge Plan:  [place]  In-House Referral:     Discharge planning Services  CM Consult  Post Acute Care Choice:    Choice offered to:  Patient  DME Arranged:    DME Agency:     HH Arranged:    [place] Agency:     Status of Service:  Completed, signed off  Medicare Important Message Given:  Yes-second notification given Date Medicare IM Given:    Medicare IM give by:    Date Additional Medicare IM Given:    Additional Medicare Important Message give by:     If discussed at [place] of Stay Meetings, dates discussed:    Additional Comments:  Dessa Phi, RN 07/23/2015, 1:49 PM

## 2015-07-23 NOTE — Discharge Summary (Addendum)
Physician Discharge Summary   Patient ID: DENINE BROTZ MRN: [id number] DOB/AGE: 1931-07-30 81 y.o.  Admit date: 07/15/2015 Discharge date: 07/23/2015  Primary Care Physician:  Donnajean Lopes, MD  Discharge Diagnoses:    . Altered mental state/delirium likely due to prolonged side effect of the steroid-induced and narcotics, improved   Essential hypertension   Diabetes mellitus   History of breast CA post lumpectomy   Consults:  Psychiatry, Dr. Kathie Dike Neurology, Dr. Doy Mince   Recommendations for Outpatient Follow-up:  Patient is recommended supervision, her mental status has significantly improved from the time of admission  Per psychiatry, patient was started on Seroquel 25 mg at bedtime. It can be weaned off once patient's mental status has returned completely to baseline.  TESTS THAT NEED FOLLOW-UP BMET   DIET: Heart healthy diet    Allergies:   Allergies  Allergen Reactions  . Demerol [Meperidine] Nausea Only     Discharge Medications:   Medication List    STOP taking these medications        oxyCODONE 5 MG immediate release tablet  Commonly known as:  Oxy IR/ROXICODONE     predniSONE 5 MG tablet  Commonly known as:  DELTASONE      TAKE these medications        allopurinol 300 MG tablet  Commonly known as:  ZYLOPRIM  Take 300 mg by mouth daily.     colchicine 0.6 MG tablet  Take 0.6 mg by mouth daily.     etodolac 300 MG capsule  Commonly known as:  LODINE  Take 300 mg by mouth daily as needed (joint pain.).     folic acid 1 MG tablet  Commonly known as:  FOLVITE  Take 1 tablet (1 mg total) by mouth daily.     gemfibrozil 600 MG tablet  Commonly known as:  LOPID  Take 600 mg by mouth daily.     ibuprofen 200 MG tablet  Commonly known as:  ADVIL,MOTRIN  Take 200 mg by mouth every 6 (six) hours as needed for pain.     loratadine 10 MG tablet  Commonly known as:  CLARITIN  Take 10 mg by mouth daily.      losartan-hydrochlorothiazide 50-12.5 MG per tablet  Commonly known as:  HYZAAR  Take 1 tablet by mouth daily.     metFORMIN 500 MG tablet  Commonly known as:  GLUCOPHAGE  Take 500 mg by mouth daily.     MULTI-VITAMIN PO  Take 1 tablet by mouth daily.     omeprazole 20 MG capsule  Commonly known as:  PRILOSEC  Take 20 mg by mouth daily as needed (acid reflux).     QUEtiapine 25 MG tablet  Commonly known as:  SEROQUEL  Take 1 tablet (25 mg total) by mouth at bedtime.     traMADol 50 MG tablet  Commonly known as:  ULTRAM  Take 1 tablet (50 mg total) by mouth every 8 (eight) hours as needed for severe pain.     vitamin C 1000 MG tablet  Take 1,000 mg by mouth 2 (two) times daily.     Vitamin E 400 UNITS Tabs  Take 400 Units by mouth daily.         Brief H and P: For complete details please refer to admission H and P, but in brief Patient is 81 year old female withstage IA ER PR positive invasive cancer of the right breast, status post right breast lumpectomy, diabetes, hypertension had originally presented to ED with  altered mental status. Apparently patient was having back issues since the beginning of July and was being seen on an outpatient basis and was started on oxycodone and steroids. Patient also had at least one cortisone injection. Patient had been living alone. A weekend prior to admission, she was found by her son at the store confused and subsequently more cognitive issues including dialing wrong phone numbers and confused. Patient was seen in ED on 8/15 and head CT was normal. However for the next 3-4 days, patient had waxing and waning improvement. Patient was brought back to the ED due to no significant improvement in the mental status per the family.  Hospital Course:   Delirium: Improving, likely prolonged side effect of steroids and narcotics, significantly improved Patient had extensive workup including chest x-ray which was negative, UA negative for UTI. CT  head was negative for any acute CVA. Ammonia level was normal, B12, folate, HIV, RPR cortisol all negative and within normal limits. Neurology was also consulted, MRI showed cerebral atrophy otherwise no new stroke/infarct. Psychiatry was consulted and started patient on Seroquel daily. Patient has significant improvement in her mental status through the hospitalization. Patient did very well with physical therapy and an bleeding without any difficulty. She is currently stable for discharge with the supervision at home. This was discussed in detail with patient's son and daily and prior to discharge, patient may stay with her sister for some time. Home health was arranged by the case manager.   HTN (hypertension) Currently stable, continue losartan HCTZ   Diabetes mellitus without complication - Continue metformin  CA breast status post lumpectomy Stable  Day of Discharge BP 108/59 mmHg  Pulse 80  Temp(Src) 97.8 F (36.6 C) (Oral)  Resp 20  Ht 4\' 9"  (1.448 m)  Wt 60.1 kg (132 lb 7.9 oz)  BMI 28.66 kg/m2  SpO2 97%  Physical Exam: General: Alert and awake oriented x3 not in any acute distress. HEENT: anicteric sclera, pupils reactive to light and accommodation CVS: S1-S2 clear no murmur rubs or gallops Chest: clear to auscultation bilaterally, no wheezing rales or rhonchi Abdomen: soft nontender, nondistended, normal bowel sounds Extremities: no cyanosis, clubbing or edema noted bilaterally Neuro: Cranial nerves II-XII intact, no focal neurological deficits   The results of significant diagnostics from this hospitalization (including imaging, microbiology, ancillary and laboratory) are listed below for reference.    LAB RESULTS: Basic Metabolic Panel:  Recent Labs Lab 07/17/15 0524  NA 136  K 4.0  CL 104  CO2 26  GLUCOSE 106*  BUN 15  CREATININE 0.70  CALCIUM 10.1   Liver Function Tests: No results for input(s): AST, ALT, ALKPHOS, BILITOT, PROT, ALBUMIN in the last  168 hours. No results for input(s): LIPASE, AMYLASE in the last 168 hours.  Recent Labs Lab 07/21/15 0547  AMMONIA <9*   CBC:  Recent Labs Lab 07/17/15 0524  WBC 4.8  HGB 10.1*  HCT 30.5*  MCV 93.0  PLT 252   Cardiac Enzymes: No results for input(s): CKTOTAL, CKMB, CKMBINDEX, TROPONINI in the last 168 hours. BNP: Invalid input(s): POCBNP CBG:  Recent Labs Lab 07/22/15 2220 07/23/15 0816  GLUCAP 105* 99    Significant Diagnostic Studies:  No results found.  2D ECHO:   Disposition and Follow-up: Discharge Instructions    Diet - low sodium heart healthy    Complete by:  As directed      Increase activity slowly    Complete by:  As directed  DISPOSITION: home with home PT, OT, RN, home health aide   DISCHARGE FOLLOW-UP Follow-up Information    Follow up with Donnajean Lopes, MD. Schedule an appointment as soon as possible for a visit in 10 days.   Specialty:  Internal Medicine   Why:  for hospital follow-up   Contact information:   [place] [address] [phone]        Time spent on Discharge: 35 minutes  Signed:   Zeinab Rodwell M.D. [facility] 07/23/2015, 11:42 AM Pager: [phone]

## 2015-07-23 NOTE — Progress Notes (Signed)
Attempted to make post-hospitalization follow-up appt for pt. T/C sent to voicemail. Instructions given to pt to make appt.as per Discharge Summary. Lind Guest, RN

## 2015-07-24 LAB — GLUCOSE, CAPILLARY
GLUCOSE-CAPILLARY: 99 mg/dL (ref 65–99)
GLUCOSE-CAPILLARY: 155 mg/dL — AB (ref 65–99)

## 2015-07-24 NOTE — Care Management Note (Signed)
Case Management Note  Patient Details  Name: Kelly Webster MRN: [id number] Date of Birth: 1931-06-05  Subjective/Objective:Received call from son Kelly Webster-he confirmed that he understood the outcome from medicare appeal-physician's d/c decision was upheld.  He also understood that he had until 8/26 @ 12n to stay in hospital before he would be charged out of pocket expense for hospital stay.He will come to hospital today to choose a [place] agency, & d/c home.                    Action/Plan:d/c plan home w/HHC.   Expected Discharge Date:                  Expected Discharge Plan:  [place]  In-House Referral:     Discharge planning Services  CM Consult  Post Acute Care Choice:    Choice offered to:  Patient  DME Arranged:    DME Agency:     HH Arranged:    [place] Agency:     Status of Service:  Completed, signed off  Medicare Important Message Given:  Yes-second notification given Date Medicare IM Given:    Medicare IM give by:    Date Additional Medicare IM Given:    Additional Medicare Important Message give by:     If discussed at [place] of Stay Meetings, dates discussed:    Additional Comments:  Dessa Phi, RN 07/24/2015, 11:51 AM

## 2015-07-24 NOTE — Progress Notes (Signed)
Per Dr. Latanya Maudlin, the patient is stable from an orthopedic standpoint for discharge home when medically ready. He would like to see her in the office next week for follow up.     Ardeen Jourdain, PA-C

## 2015-07-24 NOTE — Care Management Note (Signed)
Case Management Note  Patient Details  Name: Kelly Webster MRN: [id number] Date of Birth: January 29, 1931  Subjective/Objective:  Patient/son chose [facility] rep Karolee Stamps for referral, informed that patient's co pay $25 per visit for [facility].she will inform patient's son [name].                  Action/Plan:d/c plan home w/HHC.   Expected Discharge Date:                  Expected Discharge Plan:  [place]  In-House Referral:     Discharge planning Services  CM Consult  Post Acute Care Choice:    Choice offered to:  Patient  DME Arranged:    DME Agency:     HH Arranged:    [place]:  [place]  Status of Service:  Completed, signed off  Medicare Important Message Given:  Yes-second notification given Date Medicare IM Given:    Medicare IM give by:    Date Additional Medicare IM Given:    Additional Medicare Important Message give by:     If discussed at [place] of Stay Meetings, dates discussed:    Additional Comments:  Dessa Phi, RN 07/24/2015, 2:44 PM

## 2015-07-24 NOTE — Progress Notes (Signed)
[facility]                                                                              Patient Demographics  Kelly Webster, is a 81 y.o. female, DOB - Apr 22, 1931, URK:[id number]  Admit date - 07/15/2015   Admitting Physician Allyne Gee, MD  Outpatient Primary MD for the patient is Donnajean Lopes, MD  LOS - 7   Chief Complaint  Patient presents with  . Altered Mental Status    change noticed on Sunday. Pt seen yesterday for same thing.       Brief HPI   Patient is 81 year old female withstage IA ER PR positive invasive cancer of the right breast, status post right breast lumpectomy, diabetes, hypertension had originally presented to ED with altered mental status. Apparently patient was having back issues since the beginning of July and was being seen on an outpatient basis and was started on oxycodone and steroids. Patient also had at least one cortisone injection. Patient had been living alone. A weekend prior to admission, she was found by her son at the store confused and subsequently more cognitive issues including dialing wrong phone numbers and confused. Patient was seen in ED on 8/15 and head CT was normal. However for the next 3-4 days, patient had waxing and waning improvement. Patient was brought back to the ED due to no significant improvement in the mental status per the family.   Assessment & Plan    Principal Problem:   Delirium:  resolved, likely prolonged side effect of steroids and narcotics  - So far workup unrevealing, psychiatry consulted, started on Seroquel daily - Neurology recommendations appreciated, MRI negative for any CVA - B12, folate, HIV, RPR, cortisol, ammonia level negative - Patient had appeared her discharge yesterday requesting for orthopedic evaluation. I discussed in detail with Dr. Gladstone Lighter this morning who graciously had seen the patient yesterday and felt that she is stable from orthopedic standpoint for discharge home  and will see her in the office as scheduled next Monday. - Prior to my encounter, patient was talking to her daughter on the phone without any difficulty or confusion, alert and oriented 4  Active Problems:   HTN (hypertension) Currently stable, continue losartan HCTZ    Diabetes mellitus without complication - Continue metformin  CA breast status post lumpectomy Stable  Code Status: Full code  Family Communication: Discussed in detail with the patient, all imaging results, lab results explained to the patient   Disposition Plan: Patient is ready for discharge as planned yesterday. No changes in the discharge summary, done yesterday.  Time Spent in minutes  73minutes  Procedures  MRI brain  Consults   Neurology Psychiatry  DVT Prophylaxis SCD  Medications  Scheduled Meds: . allopurinol  300 mg Oral Daily  . colchicine  0.6 mg Oral Daily  . folic acid  1 mg Oral Daily  . heparin  5,000 Units Subcutaneous 3 times per day  . losartan  50 mg Oral Daily   And  . hydrochlorothiazide  12.5 mg Oral Daily  . insulin aspart  0-15 Units Subcutaneous TID WC  . metFORMIN  250 mg Oral Q breakfast  . multivitamin with minerals  1 tablet Oral Daily  . QUEtiapine  25 mg Oral QHS  . sodium chloride  3 mL Intravenous Q12H  . thiamine  100 mg Oral Daily   Continuous Infusions:  PRN Meds:.acetaminophen **OR** acetaminophen, traMADol   Antibiotics   Anti-infectives    None        Subjective:   Kelly Webster was seen and examined today. No acute issues overnight. Patient is alert and oriented. Prior to my encounter, she was talking to her daughter on the phone without any confusion or difficulty. Patient denies dizziness, chest pain, shortness of breath, abdominal pain, N/V/D/C, new weakness, numbess, tingling.   Objective:   Blood pressure 106/45, pulse 74, temperature 97.5 F (36.4 C), temperature source Oral, resp. rate 18, Webster 4\' 9"  (1.448 m), weight 60.1 kg (132  lb 7.9 oz), SpO2 98 %.  Wt Readings from Last 3 Encounters:  07/21/15 60.1 kg (132 lb 7.9 oz)  07/15/15 69.854 kg (154 lb)  04/15/14 69.854 kg (154 lb)    No intake or output data in the 24 hours ending 07/24/15 1030  Exam  General: Alert and oriented x 3, NAD  HEENT:  PERRLA, EOMI, Anicteric Sclera, mucous membranes moist.   Neck: Supple, no JVD, no masses  CVS: S1 S2 auscultated, no rubs, murmurs or gallops. Regular rate and rhythm.  Respiratory: Clear to auscultation bilaterally, no wheezing, rales or rhonchi  Abdomen: Soft, nontender, nondistended, + bowel sounds  Ext: no cyanosis clubbing or edema  Neuro: AAOx3, Cr N's II- XII. Strength 5/5 upper and lower extremities bilaterally  Skin: No rashes  Psych: Normal affect and demeanor, alert and oriented x3    Data Review   Micro Results Recent Results (from the past 240 hour(s))  Urine culture     Status: None   Collection Time: 07/14/15  4:17 PM  Result Value Ref Range Status   Specimen Description URINE, CLEAN CATCH  Final   Special Requests NONE  Final   Culture   Final    MULTIPLE SPECIES PRESENT, SUGGEST RECOLLECTION Performed at [facility]    Report Status 07/16/2015 FINAL  Final    Radiology Reports Dg Chest 2 View  07/14/2015   CLINICAL DATA:  Confusion  EXAM: CHEST  2 VIEW  COMPARISON:  None.  FINDINGS: Lungs are clear.  No pleural effusion or pneumothorax.  The heart is normal in size.  Degenerative changes of the visualized thoracolumbar spine.  IMPRESSION: No evidence of acute cardiopulmonary disease.   Electronically Signed   By: Julian Hy M.D.   On: 07/14/2015 17:32   Ct Head Wo Contrast  07/14/2015   CLINICAL DATA:  Per pt family, pt was recently prescribed prednisone (5mg ), and oxycodone hcl (5 mg) for back pain. Pt became confused on Sat 8/13 at a store and family had to pick her up. Per family pt, has been having episodes of confusion and clarity since last week  EXAM: CT HEAD  WITHOUT CONTRAST  TECHNIQUE: Contiguous axial images were obtained from the base of the skull through the vertex without intravenous contrast.  COMPARISON:  None.  FINDINGS: The ventricles are normal in configuration. There is age related ventricular and sulcal enlargement. No hydrocephalus.  There are no parenchymal masses or mass effect. There is no evidence of a cortical infarct. Mild white matter hypoattenuation is noted consistent with chronic microvascular ischemic change.  There are no extra-axial masses or abnormal fluid  collections.  There is no intracranial hemorrhage.  Single opacified posterior right ethmoid air cell. Minor dependent mucosal thickening in the left sphenoid sinus. Remaining visualized sinuses are clear as are the mastoid air cells. No skull lesion.  IMPRESSION: 1. No acute intracranial abnormalities. 2. Age related volume loss. Mild chronic microvascular ischemic change.   Electronically Signed   By: Lajean Manes M.D.   On: 07/14/2015 16:57   Ct Lumbar Spine W Contrast  07/01/2015   CLINICAL DATA:  Low back pain. LEFT hip pain. Symptoms developed after twisting injury.  EXAM: LUMBAR MYELOGRAM  FLUOROSCOPY TIME:  25 seconds corresponding to a Dose Area Product of 209 Gy*m2  PROCEDURE: After thorough discussion of risks and benefits of the procedure including bleeding, infection, injury to nerves, blood vessels, adjacent structures as well as headache and CSF leak, written and oral informed consent was obtained. Consent was obtained by Dr. Rolla Flatten. Time out form was completed.  Patient was positioned prone on the fluoroscopy table. Local anesthesia was provided with 1% lidocaine without epinephrine after prepped and draped in the usual sterile fashion. Puncture was performed at L5-S1 using a 3 1/2 inch 22-gauge spinal needle via midline approach. Using a single pass through the dura, the needle was placed within the thecal sac, with return of clear CSF. 15 mL of Omnipaque-180 was  injected into the thecal sac, with normal opacification of the nerve roots and cauda equina consistent with free flow within the subarachnoid space.  I personally performed the lumbar puncture and administered the intrathecal contrast. I also personally supervised acquisition of the myelogram images.  TECHNIQUE: Contiguous axial images were obtained through the Lumbar spine after the intrathecal infusion of infusion. Coronal and sagittal reconstructions were obtained of the axial image sets.  COMPARISON:  None  FINDINGS: LUMBAR MYELOGRAM FINDINGS:  Good opacification lumbar subarachnoid space. Degenerative scoliosis convex RIGHT with asymmetric loss of interspace Webster at L2-3 and L3-4 on the LEFT and asymmetric loss of interspace Webster at L4-5 on the RIGHT. Myelographically, lateral recess stenosis is apparent at L2-3 and L3-4 on the LEFT with effacement of the LEFT L3 and LEFT L4 nerve roots respectively. These changes are worse with patient upright, particularly at L3-4.  There is a large ventral defect at L1-2 consistent with a central disc extrusion. This lies below the conus. Trace retrolisthesis at this level appears facet mediated. There is moderate stenosis at this level with effacement of the LEFT L2 nerve root.  Aside from L1-2, the alignment is anatomic. There is no significant dynamic instability although the stenosis at L1-2 is worse with patient upright.  CT LUMBAR MYELOGRAM FINDINGS:  Segmentation: Normal.  Alignment:  Normal.  Vertebrae: No worrisome osseous lesion.  Conus medullaris: Normal in size, signal, and location.  Paraspinal tissues: No evidence for hydronephrosis or paravertebral mass. Moderate vascular calcification.  Disc levels:  L1-L2: Central and leftward disc extrusion. Trace retrolisthesis. Mild facet arthropathy. Slight effacement LEFT L2 nerve root. Mild central canal stenosis.  L2-L3: Shallow central protrusion. Marked leftward foraminal and extraforaminal spurring. Mild facet  arthropathy. No subarticular zone narrowing of significance. LEFT L2 nerve root impingement in the foramen is possible however.  L3-L4: Central and leftward protrusion extends into the foramen. Asymmetric facet arthropathy also on the LEFT of moderate degree. Asymmetric loss of interspace Webster on the LEFT. LEFT L4 and LEFT L3 nerve root impingement are likely.  L4-L5: Moderate disc space narrowing is worse on the RIGHT. Asymmetric subarticular zone and foraminal  zone narrowing due to a combination of disc material and bony overgrowth. RIGHT L4 and RIGHT L5 nerve root impingement are worsened by mild to moderate facet arthropathy.  L5-S1: Calcified annulus without frank protrusion. Mild osteophytic ridging extends into both foramina. L5 or S1 nerve root impingement is not established.  Examination of the SI joints reveals mild sclerosis not unexpected for age. There is no erosive change or fusion. There is no focal osseous lesion.  IMPRESSION: LUMBAR MYELOGRAM IMPRESSION:  Potentially symptomatic LEFT-sided neural impingement at L1-2 due to large disc extrusion, as well as degenerative scoliosis with asymmetric loss of interspace Webster at L2-3 and L3-4 both the LEFT. No dynamic instability although the stenosis at L1-2 is worse with patient upright, and the LEFT-sided defect at L3-4 is also worse with patient standing.  CT LUMBAR MYELOGRAM IMPRESSION:  Central and leftward disc extrusion at L1-2. Moderate stenosis at this level is worse on the LEFT.  Asymmetric subarticular zone and foraminal zone narrowing at L3-4 on the LEFT is multifactorial, related to disc protrusion, asymmetric loss of interspace Webster, and bony overgrowth. Moderate facet arthropathy is observed at this level as well.  Shallow central protrusion at L2-3 with marked leftward foraminal and extraforaminal spurring in asymmetric loss of interspace Webster on the LEFT. LEFT L2 nerve root impingement in the foramen is possible.  No SI joint  pathology of significance.   Electronically Signed   By: Staci Righter M.D.   On: 07/01/2015 13:21   Mr Brain Wo Contrast  07/16/2015   CLINICAL DATA:  Initial evaluation for acute confusion.  EXAM: MRI HEAD WITHOUT CONTRAST  TECHNIQUE: Multiplanar, multiecho pulse sequences of the brain and surrounding structures were obtained without intravenous contrast.  COMPARISON:  Prior CT from earlier the same day.  FINDINGS: Diffuse prominence of the CSF containing spaces is compatible with generalized age-related cerebral atrophy. Patchy and confluent T2/FLAIR hyperintensity within the periventricular and deep white matter noted, most consistent with chronic small vessel ischemic disease, fairly mild for patient age.  No abnormal foci of restricted diffusion to suggest acute intracranial infarct. Normal intravascular flow voids preserved. No acute or chronic intracranial hemorrhage.  No mass lesion, midline shift, or mass effect. No hydrocephalus. No extra-axial fluid collection.  Craniocervical junction within normal limits. Pituitary gland normal.  No acute abnormality about the orbits.  Scattered mucosal thickening present within the ethmoidal air cells. Paranasal sinuses are otherwise clear. No mastoid effusion. Inner ear structures within normal limits.  Bone marrow signal intensity within normal limits. Scalp soft tissues unremarkable.  IMPRESSION: 1. No acute intracranial infarct or other abnormality identified. 2. Generalized age-related cerebral atrophy with mild chronic small vessel ischemic disease.   Electronically Signed   By: Jeannine Boga M.D.   On: 07/16/2015 00:29   Dg Myelography Lumbar Inj Lumbosacral  07/01/2015   CLINICAL DATA:  Low back pain. LEFT hip pain. Symptoms developed after twisting injury.  EXAM: LUMBAR MYELOGRAM  FLUOROSCOPY TIME:  25 seconds corresponding to a Dose Area Product of 209 Gy*m2  PROCEDURE: After thorough discussion of risks and benefits of the procedure including  bleeding, infection, injury to nerves, blood vessels, adjacent structures as well as headache and CSF leak, written and oral informed consent was obtained. Consent was obtained by Dr. Rolla Flatten. Time out form was completed.  Patient was positioned prone on the fluoroscopy table. Local anesthesia was provided with 1% lidocaine without epinephrine after prepped and draped in the usual sterile fashion. Puncture was performed at  L5-S1 using a 3 1/2 inch 22-gauge spinal needle via midline approach. Using a single pass through the dura, the needle was placed within the thecal sac, with return of clear CSF. 15 mL of Omnipaque-180 was injected into the thecal sac, with normal opacification of the nerve roots and cauda equina consistent with free flow within the subarachnoid space.  I personally performed the lumbar puncture and administered the intrathecal contrast. I also personally supervised acquisition of the myelogram images.  TECHNIQUE: Contiguous axial images were obtained through the Lumbar spine after the intrathecal infusion of infusion. Coronal and sagittal reconstructions were obtained of the axial image sets.  COMPARISON:  None  FINDINGS: LUMBAR MYELOGRAM FINDINGS:  Good opacification lumbar subarachnoid space. Degenerative scoliosis convex RIGHT with asymmetric loss of interspace Webster at L2-3 and L3-4 on the LEFT and asymmetric loss of interspace Webster at L4-5 on the RIGHT. Myelographically, lateral recess stenosis is apparent at L2-3 and L3-4 on the LEFT with effacement of the LEFT L3 and LEFT L4 nerve roots respectively. These changes are worse with patient upright, particularly at L3-4.  There is a large ventral defect at L1-2 consistent with a central disc extrusion. This lies below the conus. Trace retrolisthesis at this level appears facet mediated. There is moderate stenosis at this level with effacement of the LEFT L2 nerve root.  Aside from L1-2, the alignment is anatomic. There is no significant  dynamic instability although the stenosis at L1-2 is worse with patient upright.  CT LUMBAR MYELOGRAM FINDINGS:  Segmentation: Normal.  Alignment:  Normal.  Vertebrae: No worrisome osseous lesion.  Conus medullaris: Normal in size, signal, and location.  Paraspinal tissues: No evidence for hydronephrosis or paravertebral mass. Moderate vascular calcification.  Disc levels:  L1-L2: Central and leftward disc extrusion. Trace retrolisthesis. Mild facet arthropathy. Slight effacement LEFT L2 nerve root. Mild central canal stenosis.  L2-L3: Shallow central protrusion. Marked leftward foraminal and extraforaminal spurring. Mild facet arthropathy. No subarticular zone narrowing of significance. LEFT L2 nerve root impingement in the foramen is possible however.  L3-L4: Central and leftward protrusion extends into the foramen. Asymmetric facet arthropathy also on the LEFT of moderate degree. Asymmetric loss of interspace Webster on the LEFT. LEFT L4 and LEFT L3 nerve root impingement are likely.  L4-L5: Moderate disc space narrowing is worse on the RIGHT. Asymmetric subarticular zone and foraminal zone narrowing due to a combination of disc material and bony overgrowth. RIGHT L4 and RIGHT L5 nerve root impingement are worsened by mild to moderate facet arthropathy.  L5-S1: Calcified annulus without frank protrusion. Mild osteophytic ridging extends into both foramina. L5 or S1 nerve root impingement is not established.  Examination of the SI joints reveals mild sclerosis not unexpected for age. There is no erosive change or fusion. There is no focal osseous lesion.  IMPRESSION: LUMBAR MYELOGRAM IMPRESSION:  Potentially symptomatic LEFT-sided neural impingement at L1-2 due to large disc extrusion, as well as degenerative scoliosis with asymmetric loss of interspace Webster at L2-3 and L3-4 both the LEFT. No dynamic instability although the stenosis at L1-2 is worse with patient upright, and the LEFT-sided defect at L3-4 is also  worse with patient standing.  CT LUMBAR MYELOGRAM IMPRESSION:  Central and leftward disc extrusion at L1-2. Moderate stenosis at this level is worse on the LEFT.  Asymmetric subarticular zone and foraminal zone narrowing at L3-4 on the LEFT is multifactorial, related to disc protrusion, asymmetric loss of interspace Webster, and bony overgrowth. Moderate facet arthropathy is observed at  this level as well.  Shallow central protrusion at L2-3 with marked leftward foraminal and extraforaminal spurring in asymmetric loss of interspace Webster on the LEFT. LEFT L2 nerve root impingement in the foramen is possible.  No SI joint pathology of significance.   Electronically Signed   By: Staci Righter M.D.   On: 07/01/2015 13:21    CBC No results for input(s): WBC, HGB, HCT, PLT, MCV, MCH, MCHC, RDW, LYMPHSABS, MONOABS, EOSABS, BASOSABS, BANDABS in the last 168 hours.  Invalid input(s): NEUTRABS, BANDSABD  Chemistries  No results for input(s): NA, K, CL, CO2, GLUCOSE, BUN, CREATININE, CALCIUM, MG, AST, ALT, ALKPHOS, BILITOT in the last 168 hours.  Invalid input(s): GFRCGP ------------------------------------------------------------------------------------------------------------------ estimated creatinine clearance is 40.4 mL/min (by C-G formula based on Cr of 0.7). ------------------------------------------------------------------------------------------------------------------ No results for input(s): HGBA1C in the last 72 hours. ------------------------------------------------------------------------------------------------------------------ No results for input(s): CHOL, HDL, LDLCALC, TRIG, CHOLHDL, LDLDIRECT in the last 72 hours. ------------------------------------------------------------------------------------------------------------------ No results for input(s): TSH, T4TOTAL, T3FREE, THYROIDAB in the last 72 hours.  Invalid input(s):  FREET3 ------------------------------------------------------------------------------------------------------------------ No results for input(s): VITAMINB12, FOLATE, FERRITIN, TIBC, IRON, RETICCTPCT in the last 72 hours.  Coagulation profile No results for input(s): INR, PROTIME in the last 168 hours.  No results for input(s): DDIMER in the last 72 hours.  Cardiac Enzymes No results for input(s): CKMB, TROPONINI, MYOGLOBIN in the last 168 hours.  Invalid input(s): CK ------------------------------------------------------------------------------------------------------------------ Invalid input(s): [place]  07/22/15 2220 07/23/15 0816 07/23/15 1158 07/23/15 1655 07/23/15 2137 07/24/15 0732  GLUCAP 105* 99 133* 90 141* 48     Kaleia Longhi M.D. [facility] 07/24/2015, 10:30 AM  Pager: [phone] Between 7am to 7pm - call Pager - [phone]  After 7pm go to [email or web address] - password [email or web address]  Call night coverage person covering after 7pm

## 2015-07-24 NOTE — Care Management Note (Signed)
Case Management Note  Patient Details  Name: Kelly Webster MRN: [id number] Date of Birth: 1931/03/01  Subjective/Objective:   Received outcome from medicare appeal-Their Physician agreed to the discharge from our physician. Patient is liable for ongoing hospital stay starting 8/26 @ 12n. Son don was also informed by medicare appeal of this also.Patient has been informed, also left vm w/son [name] & my call back tel#.  I have provided patient again w/HHC agency list, awaiting choice.              Action/Plan:d/c home.   Expected Discharge Date:                  Expected Discharge Plan:  [place]  In-House Referral:     Discharge planning Services  CM Consult  Post Acute Care Choice:    Choice offered to:  Patient  DME Arranged:    DME Agency:     HH Arranged:    [place] Agency:     Status of Service:  Completed, signed off  Medicare Important Message Given:  Yes-second notification given Date Medicare IM Given:    Medicare IM give by:    Date Additional Medicare IM Given:    Additional Medicare Important Message give by:     If discussed at [place] of Stay Meetings, dates discussed:    Additional Comments:  Dessa Phi, RN 07/24/2015, 11:23 AM

## 2015-11-21 IMAGING — CT CT L SPINE W/ CM
3 of 8 series · 9 of 33 positions shown, 10 images · non-contrast
Comparison: None

CLINICAL DATA: Low back pain. LEFT hip pain. Symptoms developed
after twisting injury.
TECHNIQUE: Contiguous axial images were obtained through the Lumbar spine after
the intrathecal infusion of infusion. Coronal and sagittal
reconstructions were obtained of the axial image sets.

[Series 3: l spine soft · axial · 0.27mm/px · z∈[-160,-160]mm · 1 of 81 slices shown, 2 images]
[im 46/81  soft-tissue]
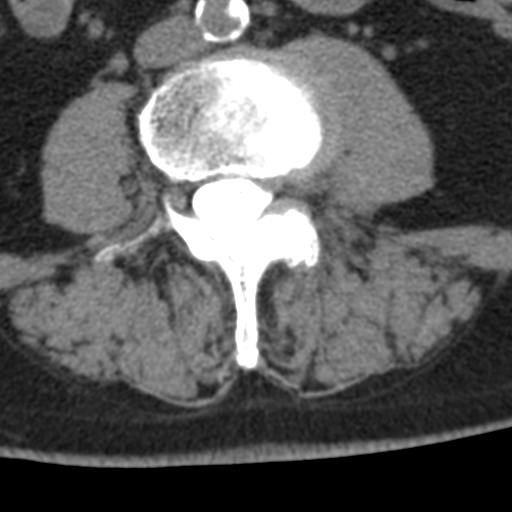
[im 46/81  bone]
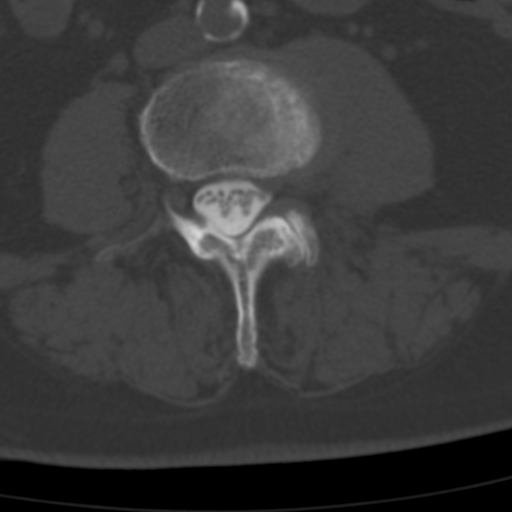

[Series 7: bone cor · coronal · 0.26mm/px · 3 of 35 slices shown]
[im 7/35  bone]
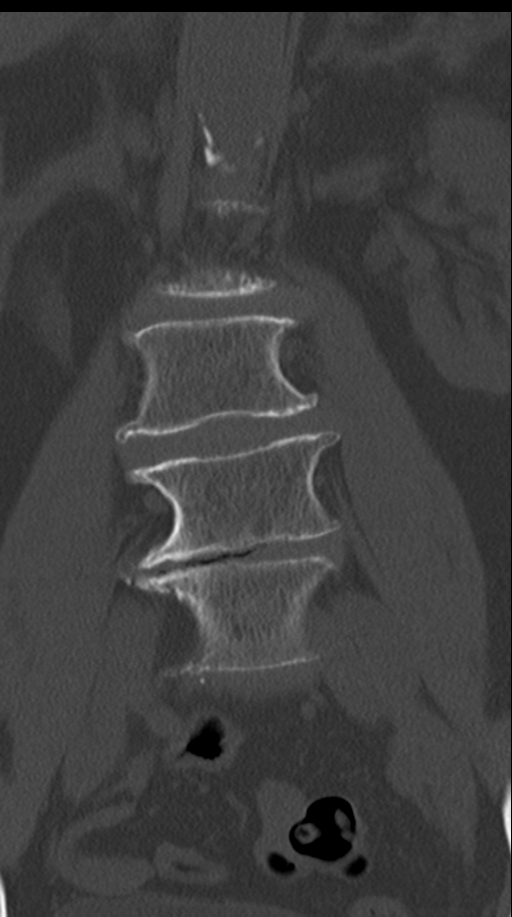
[im 14/35  bone]
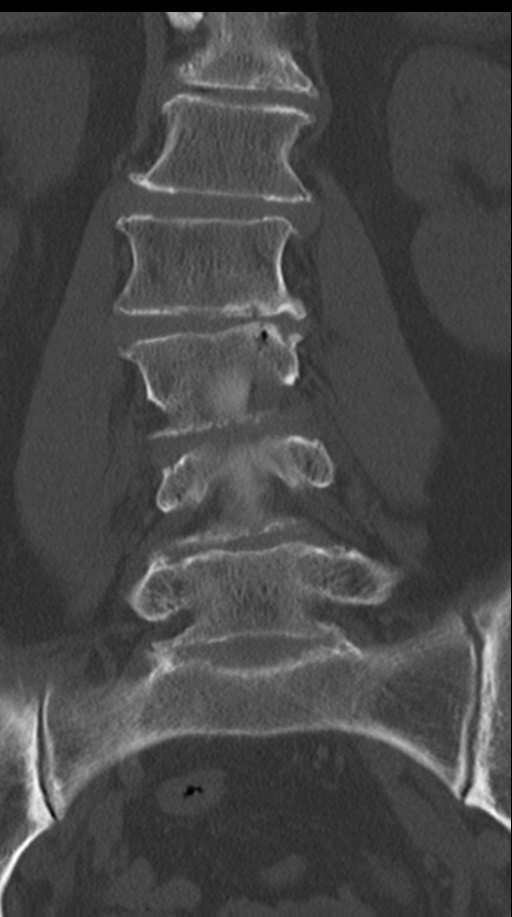
[im 21/35  bone]
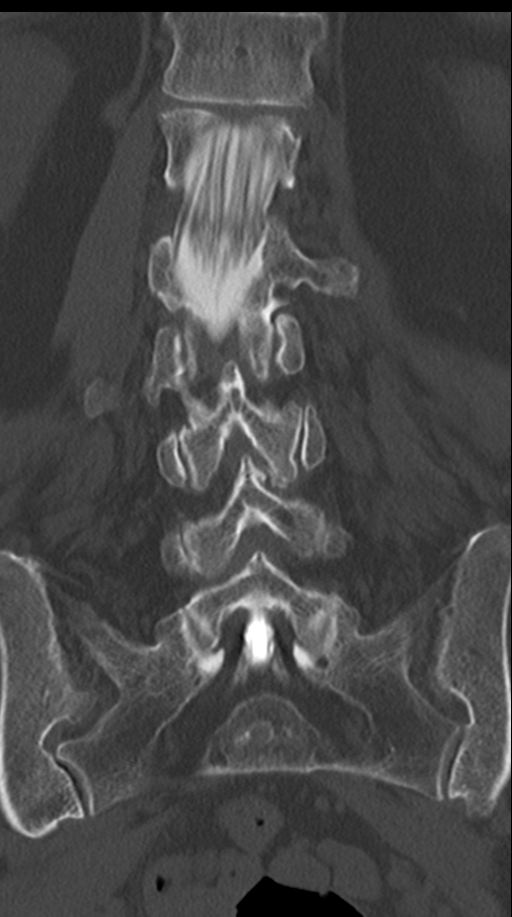

[Series 8: sag bone · sagittal · 0.26mm/px · 5 of 32 slices shown]
[im 11/32  bone]
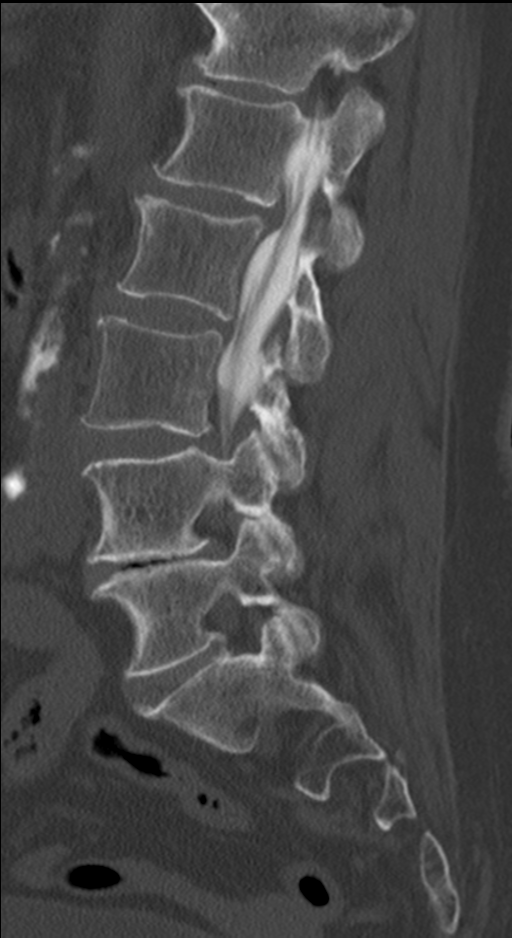
[im 13/32  bone]
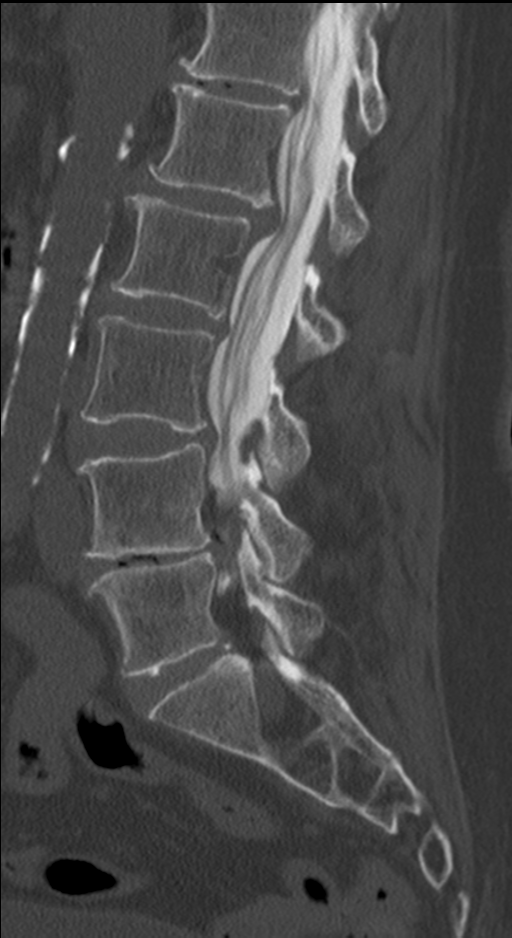
[im 16/32  bone]
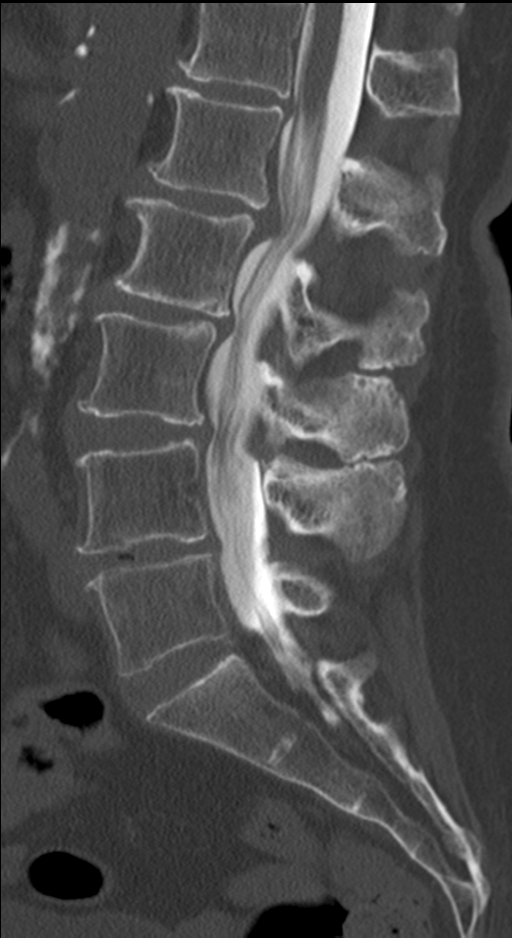
[im 19/32  bone]
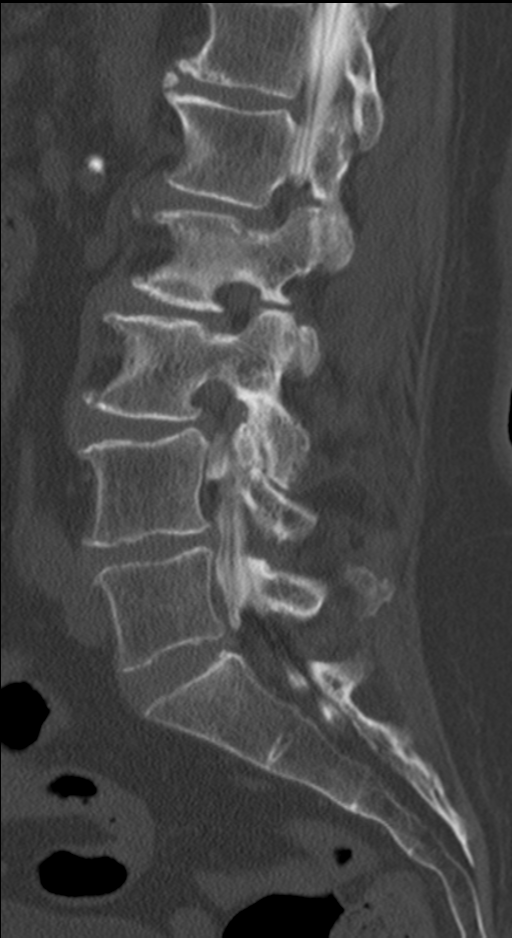
[im 21/32  bone]
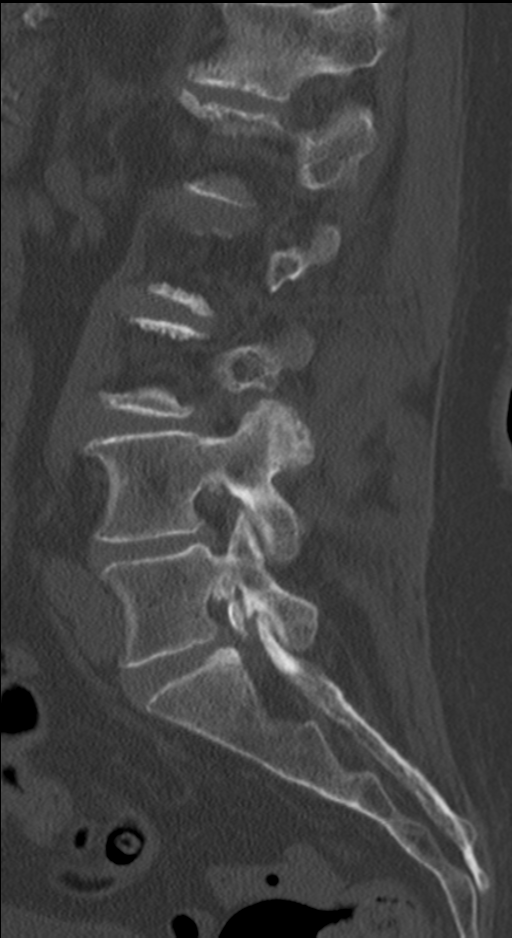

[9 of 33 positions shown; findings below may reference images not displayed]

EXAM:
LUMBAR MYELOGRAM

FLUOROSCOPY TIME:  25 seconds corresponding to a Dose Area Product
of 209 ?Gy*m2

PROCEDURE:
After thorough discussion of risks and benefits of the procedure
including bleeding, infection, injury to nerves, blood vessels,
adjacent structures as well as headache and CSF leak, written and
oral informed consent was obtained. Consent was obtained by Dr. Roger
Luitel. Time out form was completed.

Patient was positioned prone on the fluoroscopy table. Local
anesthesia was provided with 1% lidocaine without epinephrine after
prepped and draped in the usual sterile fashion. Puncture was
performed at L5-S1 using a 3 1/2 inch 22-gauge spinal needle via
midline approach. Using a single pass through the dura, the needle
was placed within the thecal sac, with return of clear CSF. 15 mL of
Emnipaque-[id number] was injected into the thecal sac, with normal
opacification of the nerve roots and cauda equina consistent with
free flow within the subarachnoid space.

I personally performed the lumbar puncture and administered the
intrathecal contrast. I also personally supervised acquisition of
the myelogram images.
FINDINGS: LUMBAR MYELOGRAM FINDINGS:

Good opacification lumbar subarachnoid space. Degenerative scoliosis
convex RIGHT with asymmetric loss of interspace height at L2-3 and
L3-4 on the LEFT and asymmetric loss of interspace height at L4-5 on
the RIGHT. Myelographically, lateral recess stenosis is apparent at
L2-3 and L3-4 on the LEFT with effacement of the LEFT L3 and LEFT L4
nerve roots respectively. These changes are worse with patient
upright, particularly at L3-4.

There is a large ventral defect at L1-2 consistent with a central
disc extrusion. This lies below the conus. Trace retrolisthesis at
this level appears facet mediated. There is moderate stenosis at
this level with effacement of the LEFT L2 nerve root.

Aside from L1-2, the alignment is anatomic. There is no significant
dynamic instability although the stenosis at L1-2 is worse with
patient upright.

CT LUMBAR MYELOGRAM FINDINGS:

Segmentation: Normal.

Alignment:  Normal.

Vertebrae: No worrisome osseous lesion.

Conus medullaris: Normal in size, signal, and location.

Paraspinal tissues: No evidence for hydronephrosis or paravertebral
mass. Moderate vascular calcification.

Disc levels:

L1-L2: Central and leftward disc extrusion. Trace retrolisthesis.
Mild facet arthropathy. Slight effacement LEFT L2 nerve root. Mild
central canal stenosis.

L2-L3: Shallow central protrusion. Marked leftward foraminal and
extraforaminal spurring. Mild facet arthropathy. No subarticular
zone narrowing of significance. LEFT L2 nerve root impingement in
the foramen is possible however.

L3-L4: Central and leftward protrusion extends into the foramen.
Asymmetric facet arthropathy also on the LEFT of moderate degree.
Asymmetric loss of interspace height on the LEFT. LEFT L4 and LEFT
L3 nerve root impingement are likely.

L4-L5: Moderate disc space narrowing is worse on the RIGHT.
Asymmetric subarticular zone and foraminal zone narrowing due to a
combination of disc material and bony overgrowth. RIGHT L4 and RIGHT
L5 nerve root impingement are worsened by mild to moderate facet
arthropathy.

L5-S1: Calcified annulus without frank protrusion. Mild osteophytic
ridging extends into both foramina. L5 or S1 nerve root impingement
is not established.

Examination of the SI joints reveals mild sclerosis not unexpected
for age. There is no erosive change or fusion. There is no focal
osseous lesion.
IMPRESSION: LUMBAR MYELOGRAM IMPRESSION:

Potentially symptomatic LEFT-sided neural impingement at L1-2 due to
large disc extrusion, as well as degenerative scoliosis with
asymmetric loss of interspace height at L2-3 and L3-4 both the LEFT.
No dynamic instability although the stenosis at L1-2 is worse with
patient upright, and the LEFT-sided defect at L3-4 is also worse
with patient standing.

CT LUMBAR MYELOGRAM IMPRESSION:

Central and leftward disc extrusion at L1-2. Moderate stenosis at
this level is worse on the LEFT.

Asymmetric subarticular zone and foraminal zone narrowing at L3-4 on
the LEFT is multifactorial, related to disc protrusion, asymmetric
loss of interspace height, and bony overgrowth. Moderate facet
arthropathy is observed at this level as well.

Shallow central protrusion at L2-3 with marked leftward foraminal
and extraforaminal spurring in asymmetric loss of interspace height
on the LEFT. LEFT L2 nerve root impingement in the foramen is
possible.

No SI joint pathology of significance.

## 2015-12-19 DIAGNOSIS — M170 Bilateral primary osteoarthritis of knee: Secondary | ICD-10-CM | POA: Diagnosis not present

## 2015-12-25 DIAGNOSIS — C50911 Malignant neoplasm of unspecified site of right female breast: Secondary | ICD-10-CM | POA: Diagnosis not present

## 2016-02-17 DIAGNOSIS — H353211 Exudative age-related macular degeneration, right eye, with active choroidal neovascularization: Secondary | ICD-10-CM | POA: Diagnosis not present

## 2016-02-17 DIAGNOSIS — H2513 Age-related nuclear cataract, bilateral: Secondary | ICD-10-CM | POA: Diagnosis not present

## 2016-02-17 DIAGNOSIS — D3131 Benign neoplasm of right choroid: Secondary | ICD-10-CM | POA: Diagnosis not present

## 2016-02-24 DIAGNOSIS — H353211 Exudative age-related macular degeneration, right eye, with active choroidal neovascularization: Secondary | ICD-10-CM | POA: Diagnosis not present

## 2016-02-24 DIAGNOSIS — H353122 Nonexudative age-related macular degeneration, left eye, intermediate dry stage: Secondary | ICD-10-CM | POA: Diagnosis not present

## 2016-03-23 DIAGNOSIS — H353211 Exudative age-related macular degeneration, right eye, with active choroidal neovascularization: Secondary | ICD-10-CM | POA: Diagnosis not present

## 2016-04-19 DIAGNOSIS — M170 Bilateral primary osteoarthritis of knee: Secondary | ICD-10-CM | POA: Diagnosis not present

## 2016-04-20 ENCOUNTER — Other Ambulatory Visit: Payer: Self-pay | Admitting: General Surgery

## 2016-04-20 DIAGNOSIS — H353211 Exudative age-related macular degeneration, right eye, with active choroidal neovascularization: Secondary | ICD-10-CM | POA: Diagnosis not present

## 2016-04-20 DIAGNOSIS — Z853 Personal history of malignant neoplasm of breast: Secondary | ICD-10-CM

## 2016-05-17 DIAGNOSIS — N182 Chronic kidney disease, stage 2 (mild): Secondary | ICD-10-CM | POA: Diagnosis not present

## 2016-05-17 DIAGNOSIS — E784 Other hyperlipidemia: Secondary | ICD-10-CM | POA: Diagnosis not present

## 2016-05-17 DIAGNOSIS — E1129 Type 2 diabetes mellitus with other diabetic kidney complication: Secondary | ICD-10-CM | POA: Diagnosis not present

## 2016-05-24 DIAGNOSIS — H353211 Exudative age-related macular degeneration, right eye, with active choroidal neovascularization: Secondary | ICD-10-CM | POA: Diagnosis not present

## 2016-05-26 ENCOUNTER — Ambulatory Visit
Admission: RE | Admit: 2016-05-26 | Discharge: 2016-05-26 | Disposition: A | Discharge status: Home / Self Care | Payer: PPO | Source: Ambulatory Visit | Attending: General Surgery | Admitting: General Surgery

## 2016-05-26 DIAGNOSIS — R928 Other abnormal and inconclusive findings on diagnostic imaging of breast: Secondary | ICD-10-CM | POA: Diagnosis not present

## 2016-05-26 DIAGNOSIS — Z853 Personal history of malignant neoplasm of breast: Secondary | ICD-10-CM

## 2016-06-03 DIAGNOSIS — Z0000 Encounter for general adult medical examination without abnormal findings: Secondary | ICD-10-CM | POA: Diagnosis not present

## 2016-06-03 DIAGNOSIS — N182 Chronic kidney disease, stage 2 (mild): Secondary | ICD-10-CM | POA: Diagnosis not present

## 2016-06-03 DIAGNOSIS — C50919 Malignant neoplasm of unspecified site of unspecified female breast: Secondary | ICD-10-CM | POA: Diagnosis not present

## 2016-06-03 DIAGNOSIS — E1129 Type 2 diabetes mellitus with other diabetic kidney complication: Secondary | ICD-10-CM | POA: Diagnosis not present

## 2016-06-03 DIAGNOSIS — Z1389 Encounter for screening for other disorder: Secondary | ICD-10-CM | POA: Diagnosis not present

## 2016-06-03 DIAGNOSIS — Z6834 Body mass index (BMI) 34.0-34.9, adult: Secondary | ICD-10-CM | POA: Diagnosis not present

## 2016-06-03 DIAGNOSIS — R410 Disorientation, unspecified: Secondary | ICD-10-CM | POA: Diagnosis not present

## 2016-06-03 DIAGNOSIS — K648 Other hemorrhoids: Secondary | ICD-10-CM | POA: Diagnosis not present

## 2016-06-03 DIAGNOSIS — M859 Disorder of bone density and structure, unspecified: Secondary | ICD-10-CM | POA: Diagnosis not present

## 2016-06-03 DIAGNOSIS — E784 Other hyperlipidemia: Secondary | ICD-10-CM | POA: Diagnosis not present

## 2016-06-03 DIAGNOSIS — I10 Essential (primary) hypertension: Secondary | ICD-10-CM | POA: Diagnosis not present

## 2016-06-03 DIAGNOSIS — M25562 Pain in left knee: Secondary | ICD-10-CM | POA: Diagnosis not present

## 2016-06-21 DIAGNOSIS — H353211 Exudative age-related macular degeneration, right eye, with active choroidal neovascularization: Secondary | ICD-10-CM | POA: Diagnosis not present

## 2016-07-27 DIAGNOSIS — H353211 Exudative age-related macular degeneration, right eye, with active choroidal neovascularization: Secondary | ICD-10-CM | POA: Diagnosis not present

## 2016-08-06 DIAGNOSIS — M1712 Unilateral primary osteoarthritis, left knee: Secondary | ICD-10-CM | POA: Diagnosis not present

## 2016-08-06 DIAGNOSIS — M170 Bilateral primary osteoarthritis of knee: Secondary | ICD-10-CM | POA: Diagnosis not present

## 2016-08-06 DIAGNOSIS — M1711 Unilateral primary osteoarthritis, right knee: Secondary | ICD-10-CM | POA: Diagnosis not present

## 2016-08-31 DIAGNOSIS — H353211 Exudative age-related macular degeneration, right eye, with active choroidal neovascularization: Secondary | ICD-10-CM | POA: Diagnosis not present

## 2016-10-05 DIAGNOSIS — H353211 Exudative age-related macular degeneration, right eye, with active choroidal neovascularization: Secondary | ICD-10-CM | POA: Diagnosis not present

## 2016-11-09 DIAGNOSIS — Z23 Encounter for immunization: Secondary | ICD-10-CM | POA: Diagnosis not present

## 2016-11-15 DIAGNOSIS — M1711 Unilateral primary osteoarthritis, right knee: Secondary | ICD-10-CM | POA: Diagnosis not present

## 2016-11-15 DIAGNOSIS — M170 Bilateral primary osteoarthritis of knee: Secondary | ICD-10-CM | POA: Diagnosis not present

## 2016-11-15 DIAGNOSIS — M1712 Unilateral primary osteoarthritis, left knee: Secondary | ICD-10-CM | POA: Diagnosis not present

## 2016-11-16 DIAGNOSIS — H353211 Exudative age-related macular degeneration, right eye, with active choroidal neovascularization: Secondary | ICD-10-CM | POA: Diagnosis not present

## 2016-12-28 DIAGNOSIS — H353211 Exudative age-related macular degeneration, right eye, with active choroidal neovascularization: Secondary | ICD-10-CM | POA: Diagnosis not present

## 2017-01-14 DIAGNOSIS — K648 Other hemorrhoids: Secondary | ICD-10-CM | POA: Diagnosis not present

## 2017-01-14 DIAGNOSIS — Z853 Personal history of malignant neoplasm of breast: Secondary | ICD-10-CM | POA: Diagnosis not present

## 2017-02-09 DIAGNOSIS — H353211 Exudative age-related macular degeneration, right eye, with active choroidal neovascularization: Secondary | ICD-10-CM | POA: Diagnosis not present

## 2017-02-22 DIAGNOSIS — M1712 Unilateral primary osteoarthritis, left knee: Secondary | ICD-10-CM | POA: Diagnosis not present

## 2017-02-22 DIAGNOSIS — M1711 Unilateral primary osteoarthritis, right knee: Secondary | ICD-10-CM | POA: Diagnosis not present

## 2017-02-22 DIAGNOSIS — M170 Bilateral primary osteoarthritis of knee: Secondary | ICD-10-CM | POA: Diagnosis not present

## 2017-03-23 DIAGNOSIS — H353211 Exudative age-related macular degeneration, right eye, with active choroidal neovascularization: Secondary | ICD-10-CM | POA: Diagnosis not present

## 2017-04-22 ENCOUNTER — Other Ambulatory Visit: Payer: Self-pay | Admitting: General Surgery

## 2017-04-22 DIAGNOSIS — Z9889 Other specified postprocedural states: Secondary | ICD-10-CM

## 2017-05-03 DIAGNOSIS — H353211 Exudative age-related macular degeneration, right eye, with active choroidal neovascularization: Secondary | ICD-10-CM | POA: Diagnosis not present

## 2017-05-23 DIAGNOSIS — M1711 Unilateral primary osteoarthritis, right knee: Secondary | ICD-10-CM | POA: Diagnosis not present

## 2017-05-23 DIAGNOSIS — M170 Bilateral primary osteoarthritis of knee: Secondary | ICD-10-CM | POA: Diagnosis not present

## 2017-05-23 DIAGNOSIS — M1712 Unilateral primary osteoarthritis, left knee: Secondary | ICD-10-CM | POA: Diagnosis not present

## 2017-06-14 DIAGNOSIS — E119 Type 2 diabetes mellitus without complications: Secondary | ICD-10-CM | POA: Diagnosis not present

## 2017-06-14 DIAGNOSIS — M859 Disorder of bone density and structure, unspecified: Secondary | ICD-10-CM | POA: Diagnosis not present

## 2017-06-14 DIAGNOSIS — H353211 Exudative age-related macular degeneration, right eye, with active choroidal neovascularization: Secondary | ICD-10-CM | POA: Diagnosis not present

## 2017-06-14 DIAGNOSIS — E784 Other hyperlipidemia: Secondary | ICD-10-CM | POA: Diagnosis not present

## 2017-06-14 DIAGNOSIS — E1129 Type 2 diabetes mellitus with other diabetic kidney complication: Secondary | ICD-10-CM | POA: Diagnosis not present

## 2017-06-14 DIAGNOSIS — N182 Chronic kidney disease, stage 2 (mild): Secondary | ICD-10-CM | POA: Diagnosis not present

## 2017-06-23 DIAGNOSIS — M859 Disorder of bone density and structure, unspecified: Secondary | ICD-10-CM | POA: Diagnosis not present

## 2017-06-23 DIAGNOSIS — Z1389 Encounter for screening for other disorder: Secondary | ICD-10-CM | POA: Diagnosis not present

## 2017-06-23 DIAGNOSIS — Z6832 Body mass index (BMI) 32.0-32.9, adult: Secondary | ICD-10-CM | POA: Diagnosis not present

## 2017-06-23 DIAGNOSIS — Z0000 Encounter for general adult medical examination without abnormal findings: Secondary | ICD-10-CM | POA: Diagnosis not present

## 2017-06-23 DIAGNOSIS — E1121 Type 2 diabetes mellitus with diabetic nephropathy: Secondary | ICD-10-CM | POA: Diagnosis not present

## 2017-06-23 DIAGNOSIS — R2681 Unsteadiness on feet: Secondary | ICD-10-CM | POA: Diagnosis not present

## 2017-06-23 DIAGNOSIS — E1129 Type 2 diabetes mellitus with other diabetic kidney complication: Secondary | ICD-10-CM | POA: Diagnosis not present

## 2017-06-23 DIAGNOSIS — D6489 Other specified anemias: Secondary | ICD-10-CM | POA: Diagnosis not present

## 2017-06-23 DIAGNOSIS — E668 Other obesity: Secondary | ICD-10-CM | POA: Diagnosis not present

## 2017-06-23 DIAGNOSIS — C50919 Malignant neoplasm of unspecified site of unspecified female breast: Secondary | ICD-10-CM | POA: Diagnosis not present

## 2017-06-23 DIAGNOSIS — H9193 Unspecified hearing loss, bilateral: Secondary | ICD-10-CM | POA: Diagnosis not present

## 2017-06-23 DIAGNOSIS — N182 Chronic kidney disease, stage 2 (mild): Secondary | ICD-10-CM | POA: Diagnosis not present

## 2017-06-24 DIAGNOSIS — Z1212 Encounter for screening for malignant neoplasm of rectum: Secondary | ICD-10-CM | POA: Diagnosis not present

## 2017-07-04 ENCOUNTER — Ambulatory Visit
Admission: RE | Admit: 2017-07-04 | Discharge: 2017-07-04 | Disposition: A | Discharge status: Home / Self Care | Payer: PPO | Source: Ambulatory Visit | Attending: General Surgery | Admitting: General Surgery

## 2017-07-04 DIAGNOSIS — Z9889 Other specified postprocedural states: Secondary | ICD-10-CM

## 2017-07-04 DIAGNOSIS — R928 Other abnormal and inconclusive findings on diagnostic imaging of breast: Secondary | ICD-10-CM | POA: Diagnosis not present

## 2017-07-18 DIAGNOSIS — H5203 Hypermetropia, bilateral: Secondary | ICD-10-CM | POA: Diagnosis not present

## 2017-07-18 DIAGNOSIS — H2513 Age-related nuclear cataract, bilateral: Secondary | ICD-10-CM | POA: Diagnosis not present

## 2017-07-18 DIAGNOSIS — H353211 Exudative age-related macular degeneration, right eye, with active choroidal neovascularization: Secondary | ICD-10-CM | POA: Diagnosis not present

## 2017-07-18 DIAGNOSIS — H353121 Nonexudative age-related macular degeneration, left eye, early dry stage: Secondary | ICD-10-CM | POA: Diagnosis not present

## 2017-07-26 DIAGNOSIS — H353211 Exudative age-related macular degeneration, right eye, with active choroidal neovascularization: Secondary | ICD-10-CM | POA: Diagnosis not present

## 2017-08-22 DIAGNOSIS — M1711 Unilateral primary osteoarthritis, right knee: Secondary | ICD-10-CM | POA: Diagnosis not present

## 2017-08-22 DIAGNOSIS — M1712 Unilateral primary osteoarthritis, left knee: Secondary | ICD-10-CM | POA: Diagnosis not present

## 2017-08-22 DIAGNOSIS — M170 Bilateral primary osteoarthritis of knee: Secondary | ICD-10-CM | POA: Diagnosis not present

## 2017-09-07 DIAGNOSIS — H353211 Exudative age-related macular degeneration, right eye, with active choroidal neovascularization: Secondary | ICD-10-CM | POA: Diagnosis not present

## 2017-09-07 DIAGNOSIS — Z23 Encounter for immunization: Secondary | ICD-10-CM | POA: Diagnosis not present

## 2017-10-19 DIAGNOSIS — H353211 Exudative age-related macular degeneration, right eye, with active choroidal neovascularization: Secondary | ICD-10-CM | POA: Diagnosis not present

## 2017-11-30 DIAGNOSIS — H353211 Exudative age-related macular degeneration, right eye, with active choroidal neovascularization: Secondary | ICD-10-CM | POA: Diagnosis not present

## 2017-12-13 DIAGNOSIS — M25561 Pain in right knee: Secondary | ICD-10-CM | POA: Diagnosis not present

## 2017-12-13 DIAGNOSIS — M25562 Pain in left knee: Secondary | ICD-10-CM | POA: Diagnosis not present

## 2018-01-10 DIAGNOSIS — H353122 Nonexudative age-related macular degeneration, left eye, intermediate dry stage: Secondary | ICD-10-CM | POA: Diagnosis not present

## 2018-01-10 DIAGNOSIS — H353211 Exudative age-related macular degeneration, right eye, with active choroidal neovascularization: Secondary | ICD-10-CM | POA: Diagnosis not present

## 2018-01-10 DIAGNOSIS — D3131 Benign neoplasm of right choroid: Secondary | ICD-10-CM | POA: Diagnosis not present

## 2018-01-10 DIAGNOSIS — H43813 Vitreous degeneration, bilateral: Secondary | ICD-10-CM | POA: Diagnosis not present

## 2018-02-21 DIAGNOSIS — M1711 Unilateral primary osteoarthritis, right knee: Secondary | ICD-10-CM | POA: Diagnosis not present

## 2018-02-21 DIAGNOSIS — H353211 Exudative age-related macular degeneration, right eye, with active choroidal neovascularization: Secondary | ICD-10-CM | POA: Diagnosis not present

## 2018-02-21 DIAGNOSIS — M1712 Unilateral primary osteoarthritis, left knee: Secondary | ICD-10-CM | POA: Diagnosis not present

## 2018-04-04 DIAGNOSIS — H353211 Exudative age-related macular degeneration, right eye, with active choroidal neovascularization: Secondary | ICD-10-CM | POA: Diagnosis not present

## 2018-04-17 DIAGNOSIS — M25562 Pain in left knee: Secondary | ICD-10-CM | POA: Diagnosis not present

## 2018-04-17 DIAGNOSIS — M25561 Pain in right knee: Secondary | ICD-10-CM | POA: Diagnosis not present

## 2018-05-16 DIAGNOSIS — H353211 Exudative age-related macular degeneration, right eye, with active choroidal neovascularization: Secondary | ICD-10-CM | POA: Diagnosis not present

## 2018-05-16 DIAGNOSIS — E119 Type 2 diabetes mellitus without complications: Secondary | ICD-10-CM | POA: Diagnosis not present

## 2018-05-16 DIAGNOSIS — H353122 Nonexudative age-related macular degeneration, left eye, intermediate dry stage: Secondary | ICD-10-CM | POA: Diagnosis not present

## 2018-05-16 DIAGNOSIS — D3131 Benign neoplasm of right choroid: Secondary | ICD-10-CM | POA: Diagnosis not present

## 2018-06-27 DIAGNOSIS — H353211 Exudative age-related macular degeneration, right eye, with active choroidal neovascularization: Secondary | ICD-10-CM | POA: Diagnosis not present

## 2018-08-08 DIAGNOSIS — H353211 Exudative age-related macular degeneration, right eye, with active choroidal neovascularization: Secondary | ICD-10-CM | POA: Diagnosis not present

## 2018-09-19 DIAGNOSIS — H353211 Exudative age-related macular degeneration, right eye, with active choroidal neovascularization: Secondary | ICD-10-CM | POA: Diagnosis not present

## 2018-10-30 DIAGNOSIS — M25562 Pain in left knee: Secondary | ICD-10-CM | POA: Diagnosis not present

## 2018-10-30 DIAGNOSIS — M25561 Pain in right knee: Secondary | ICD-10-CM | POA: Diagnosis not present

## 2018-10-31 DIAGNOSIS — H353211 Exudative age-related macular degeneration, right eye, with active choroidal neovascularization: Secondary | ICD-10-CM | POA: Diagnosis not present

## 2018-10-31 DIAGNOSIS — D3131 Benign neoplasm of right choroid: Secondary | ICD-10-CM | POA: Diagnosis not present

## 2018-10-31 DIAGNOSIS — H353121 Nonexudative age-related macular degeneration, left eye, early dry stage: Secondary | ICD-10-CM | POA: Diagnosis not present

## 2018-10-31 DIAGNOSIS — H43813 Vitreous degeneration, bilateral: Secondary | ICD-10-CM | POA: Diagnosis not present

## 2018-12-13 DIAGNOSIS — H353122 Nonexudative age-related macular degeneration, left eye, intermediate dry stage: Secondary | ICD-10-CM | POA: Diagnosis not present

## 2018-12-13 DIAGNOSIS — H04123 Dry eye syndrome of bilateral lacrimal glands: Secondary | ICD-10-CM | POA: Diagnosis not present

## 2018-12-13 DIAGNOSIS — H35363 Drusen (degenerative) of macula, bilateral: Secondary | ICD-10-CM | POA: Diagnosis not present

## 2018-12-13 DIAGNOSIS — H353211 Exudative age-related macular degeneration, right eye, with active choroidal neovascularization: Secondary | ICD-10-CM | POA: Diagnosis not present

## 2018-12-13 DIAGNOSIS — H35453 Secondary pigmentary degeneration, bilateral: Secondary | ICD-10-CM | POA: Diagnosis not present

## 2018-12-13 DIAGNOSIS — H25813 Combined forms of age-related cataract, bilateral: Secondary | ICD-10-CM | POA: Diagnosis not present

## 2018-12-25 DIAGNOSIS — E7849 Other hyperlipidemia: Secondary | ICD-10-CM | POA: Diagnosis not present

## 2018-12-25 DIAGNOSIS — E1129 Type 2 diabetes mellitus with other diabetic kidney complication: Secondary | ICD-10-CM | POA: Diagnosis not present

## 2018-12-25 DIAGNOSIS — C50919 Malignant neoplasm of unspecified site of unspecified female breast: Secondary | ICD-10-CM | POA: Diagnosis not present

## 2018-12-25 DIAGNOSIS — Z0000 Encounter for general adult medical examination without abnormal findings: Secondary | ICD-10-CM | POA: Diagnosis not present

## 2018-12-25 DIAGNOSIS — I10 Essential (primary) hypertension: Secondary | ICD-10-CM | POA: Diagnosis not present

## 2018-12-25 DIAGNOSIS — Z1331 Encounter for screening for depression: Secondary | ICD-10-CM | POA: Diagnosis not present

## 2018-12-25 DIAGNOSIS — H9193 Unspecified hearing loss, bilateral: Secondary | ICD-10-CM | POA: Diagnosis not present

## 2018-12-25 DIAGNOSIS — Z6828 Body mass index (BMI) 28.0-28.9, adult: Secondary | ICD-10-CM | POA: Diagnosis not present

## 2018-12-25 DIAGNOSIS — M859 Disorder of bone density and structure, unspecified: Secondary | ICD-10-CM | POA: Diagnosis not present

## 2018-12-25 DIAGNOSIS — M25562 Pain in left knee: Secondary | ICD-10-CM | POA: Diagnosis not present

## 2018-12-25 DIAGNOSIS — D6489 Other specified anemias: Secondary | ICD-10-CM | POA: Diagnosis not present

## 2018-12-25 DIAGNOSIS — R82998 Other abnormal findings in urine: Secondary | ICD-10-CM | POA: Diagnosis not present

## 2018-12-25 DIAGNOSIS — R410 Disorientation, unspecified: Secondary | ICD-10-CM | POA: Diagnosis not present

## 2019-01-05 DIAGNOSIS — Z1212 Encounter for screening for malignant neoplasm of rectum: Secondary | ICD-10-CM | POA: Diagnosis not present

## 2019-01-24 DIAGNOSIS — H353211 Exudative age-related macular degeneration, right eye, with active choroidal neovascularization: Secondary | ICD-10-CM | POA: Diagnosis not present

## 2019-02-28 DIAGNOSIS — M25512 Pain in left shoulder: Secondary | ICD-10-CM | POA: Diagnosis not present

## 2019-02-28 DIAGNOSIS — H353211 Exudative age-related macular degeneration, right eye, with active choroidal neovascularization: Secondary | ICD-10-CM | POA: Diagnosis not present

## 2019-03-19 DIAGNOSIS — M25561 Pain in right knee: Secondary | ICD-10-CM | POA: Diagnosis not present

## 2019-03-19 DIAGNOSIS — M25562 Pain in left knee: Secondary | ICD-10-CM | POA: Diagnosis not present

## 2019-03-19 DIAGNOSIS — M1711 Unilateral primary osteoarthritis, right knee: Secondary | ICD-10-CM | POA: Diagnosis not present

## 2019-03-19 DIAGNOSIS — M1712 Unilateral primary osteoarthritis, left knee: Secondary | ICD-10-CM | POA: Diagnosis not present

## 2019-03-19 DIAGNOSIS — M170 Bilateral primary osteoarthritis of knee: Secondary | ICD-10-CM | POA: Diagnosis not present

## 2019-04-02 DIAGNOSIS — H353211 Exudative age-related macular degeneration, right eye, with active choroidal neovascularization: Secondary | ICD-10-CM | POA: Diagnosis not present

## 2019-05-03 DIAGNOSIS — K648 Other hemorrhoids: Secondary | ICD-10-CM | POA: Diagnosis not present

## 2019-05-16 DIAGNOSIS — H353211 Exudative age-related macular degeneration, right eye, with active choroidal neovascularization: Secondary | ICD-10-CM | POA: Diagnosis not present

## 2019-06-21 DIAGNOSIS — M1711 Unilateral primary osteoarthritis, right knee: Secondary | ICD-10-CM | POA: Diagnosis not present

## 2019-06-21 DIAGNOSIS — M170 Bilateral primary osteoarthritis of knee: Secondary | ICD-10-CM | POA: Diagnosis not present

## 2019-06-21 DIAGNOSIS — M1712 Unilateral primary osteoarthritis, left knee: Secondary | ICD-10-CM | POA: Diagnosis not present

## 2019-06-28 DIAGNOSIS — H353211 Exudative age-related macular degeneration, right eye, with active choroidal neovascularization: Secondary | ICD-10-CM | POA: Diagnosis not present

## 2019-08-15 DIAGNOSIS — H353211 Exudative age-related macular degeneration, right eye, with active choroidal neovascularization: Secondary | ICD-10-CM | POA: Diagnosis not present

## 2019-09-26 DIAGNOSIS — H353211 Exudative age-related macular degeneration, right eye, with active choroidal neovascularization: Secondary | ICD-10-CM | POA: Diagnosis not present

## 2019-10-17 DIAGNOSIS — M1711 Unilateral primary osteoarthritis, right knee: Secondary | ICD-10-CM | POA: Diagnosis not present

## 2019-10-17 DIAGNOSIS — M1712 Unilateral primary osteoarthritis, left knee: Secondary | ICD-10-CM | POA: Diagnosis not present

## 2019-10-17 DIAGNOSIS — M170 Bilateral primary osteoarthritis of knee: Secondary | ICD-10-CM | POA: Diagnosis not present

## 2019-11-01 DIAGNOSIS — H353211 Exudative age-related macular degeneration, right eye, with active choroidal neovascularization: Secondary | ICD-10-CM | POA: Diagnosis not present

## 2019-12-13 DIAGNOSIS — H353211 Exudative age-related macular degeneration, right eye, with active choroidal neovascularization: Secondary | ICD-10-CM | POA: Diagnosis not present

## 2019-12-19 DIAGNOSIS — H353211 Exudative age-related macular degeneration, right eye, with active choroidal neovascularization: Secondary | ICD-10-CM | POA: Diagnosis not present

## 2019-12-19 DIAGNOSIS — H35363 Drusen (degenerative) of macula, bilateral: Secondary | ICD-10-CM | POA: Diagnosis not present

## 2019-12-19 DIAGNOSIS — H35453 Secondary pigmentary degeneration, bilateral: Secondary | ICD-10-CM | POA: Diagnosis not present

## 2019-12-19 DIAGNOSIS — H353122 Nonexudative age-related macular degeneration, left eye, intermediate dry stage: Secondary | ICD-10-CM | POA: Diagnosis not present

## 2019-12-19 DIAGNOSIS — H04123 Dry eye syndrome of bilateral lacrimal glands: Secondary | ICD-10-CM | POA: Diagnosis not present

## 2019-12-19 DIAGNOSIS — H25813 Combined forms of age-related cataract, bilateral: Secondary | ICD-10-CM | POA: Diagnosis not present

## 2020-01-23 DIAGNOSIS — H353211 Exudative age-related macular degeneration, right eye, with active choroidal neovascularization: Secondary | ICD-10-CM | POA: Diagnosis not present

## 2020-01-30 DIAGNOSIS — M1711 Unilateral primary osteoarthritis, right knee: Secondary | ICD-10-CM | POA: Diagnosis not present

## 2020-01-30 DIAGNOSIS — M1712 Unilateral primary osteoarthritis, left knee: Secondary | ICD-10-CM | POA: Diagnosis not present

## 2020-01-30 DIAGNOSIS — M170 Bilateral primary osteoarthritis of knee: Secondary | ICD-10-CM | POA: Diagnosis not present

## 2020-03-04 DIAGNOSIS — H353211 Exudative age-related macular degeneration, right eye, with active choroidal neovascularization: Secondary | ICD-10-CM | POA: Diagnosis not present

## 2020-04-15 DIAGNOSIS — H353211 Exudative age-related macular degeneration, right eye, with active choroidal neovascularization: Secondary | ICD-10-CM | POA: Diagnosis not present

## 2020-04-22 DIAGNOSIS — H9190 Unspecified hearing loss, unspecified ear: Secondary | ICD-10-CM | POA: Diagnosis not present

## 2020-04-22 DIAGNOSIS — I10 Essential (primary) hypertension: Secondary | ICD-10-CM | POA: Diagnosis not present

## 2020-04-22 DIAGNOSIS — R2681 Unsteadiness on feet: Secondary | ICD-10-CM | POA: Diagnosis not present

## 2020-04-22 DIAGNOSIS — M8580 Other specified disorders of bone density and structure, unspecified site: Secondary | ICD-10-CM | POA: Diagnosis not present

## 2020-04-22 DIAGNOSIS — M25562 Pain in left knee: Secondary | ICD-10-CM | POA: Diagnosis not present

## 2020-04-22 DIAGNOSIS — E1129 Type 2 diabetes mellitus with other diabetic kidney complication: Secondary | ICD-10-CM | POA: Diagnosis not present

## 2020-04-22 DIAGNOSIS — Z1331 Encounter for screening for depression: Secondary | ICD-10-CM | POA: Diagnosis not present

## 2020-04-22 DIAGNOSIS — E785 Hyperlipidemia, unspecified: Secondary | ICD-10-CM | POA: Diagnosis not present

## 2020-05-16 DIAGNOSIS — M170 Bilateral primary osteoarthritis of knee: Secondary | ICD-10-CM | POA: Diagnosis not present

## 2020-05-16 DIAGNOSIS — M1711 Unilateral primary osteoarthritis, right knee: Secondary | ICD-10-CM | POA: Diagnosis not present

## 2020-05-16 DIAGNOSIS — M1712 Unilateral primary osteoarthritis, left knee: Secondary | ICD-10-CM | POA: Diagnosis not present

## 2020-05-27 DIAGNOSIS — H353211 Exudative age-related macular degeneration, right eye, with active choroidal neovascularization: Secondary | ICD-10-CM | POA: Diagnosis not present

## 2020-07-08 DIAGNOSIS — H353211 Exudative age-related macular degeneration, right eye, with active choroidal neovascularization: Secondary | ICD-10-CM | POA: Diagnosis not present

## 2020-08-08 DIAGNOSIS — M1990 Unspecified osteoarthritis, unspecified site: Secondary | ICD-10-CM | POA: Diagnosis not present

## 2020-08-08 DIAGNOSIS — M859 Disorder of bone density and structure, unspecified: Secondary | ICD-10-CM | POA: Diagnosis not present

## 2020-08-08 DIAGNOSIS — E1129 Type 2 diabetes mellitus with other diabetic kidney complication: Secondary | ICD-10-CM | POA: Diagnosis not present

## 2020-08-08 DIAGNOSIS — M25562 Pain in left knee: Secondary | ICD-10-CM | POA: Diagnosis not present

## 2020-08-08 DIAGNOSIS — I10 Essential (primary) hypertension: Secondary | ICD-10-CM | POA: Diagnosis not present

## 2020-08-08 DIAGNOSIS — Z0000 Encounter for general adult medical examination without abnormal findings: Secondary | ICD-10-CM | POA: Diagnosis not present

## 2020-08-08 DIAGNOSIS — R82998 Other abnormal findings in urine: Secondary | ICD-10-CM | POA: Diagnosis not present

## 2020-08-08 DIAGNOSIS — Z853 Personal history of malignant neoplasm of breast: Secondary | ICD-10-CM | POA: Diagnosis not present

## 2020-08-08 DIAGNOSIS — E785 Hyperlipidemia, unspecified: Secondary | ICD-10-CM | POA: Diagnosis not present

## 2020-08-08 DIAGNOSIS — E669 Obesity, unspecified: Secondary | ICD-10-CM | POA: Diagnosis not present

## 2020-08-08 DIAGNOSIS — E1121 Type 2 diabetes mellitus with diabetic nephropathy: Secondary | ICD-10-CM | POA: Diagnosis not present

## 2020-08-08 DIAGNOSIS — M25561 Pain in right knee: Secondary | ICD-10-CM | POA: Diagnosis not present

## 2020-08-08 DIAGNOSIS — M8580 Other specified disorders of bone density and structure, unspecified site: Secondary | ICD-10-CM | POA: Diagnosis not present

## 2020-08-08 DIAGNOSIS — D649 Anemia, unspecified: Secondary | ICD-10-CM | POA: Diagnosis not present

## 2020-08-19 DIAGNOSIS — H353211 Exudative age-related macular degeneration, right eye, with active choroidal neovascularization: Secondary | ICD-10-CM | POA: Diagnosis not present

## 2020-10-02 DIAGNOSIS — H353211 Exudative age-related macular degeneration, right eye, with active choroidal neovascularization: Secondary | ICD-10-CM | POA: Diagnosis not present

## 2020-11-12 DIAGNOSIS — H353211 Exudative age-related macular degeneration, right eye, with active choroidal neovascularization: Secondary | ICD-10-CM | POA: Diagnosis not present

## 2020-11-14 DIAGNOSIS — E119 Type 2 diabetes mellitus without complications: Secondary | ICD-10-CM | POA: Diagnosis not present

## 2020-11-14 DIAGNOSIS — H5203 Hypermetropia, bilateral: Secondary | ICD-10-CM | POA: Diagnosis not present

## 2020-11-14 DIAGNOSIS — H2513 Age-related nuclear cataract, bilateral: Secondary | ICD-10-CM | POA: Diagnosis not present

## 2020-12-31 DIAGNOSIS — H353211 Exudative age-related macular degeneration, right eye, with active choroidal neovascularization: Secondary | ICD-10-CM | POA: Diagnosis not present

## 2021-02-25 DIAGNOSIS — H353211 Exudative age-related macular degeneration, right eye, with active choroidal neovascularization: Secondary | ICD-10-CM | POA: Diagnosis not present

## 2021-04-22 DIAGNOSIS — H353211 Exudative age-related macular degeneration, right eye, with active choroidal neovascularization: Secondary | ICD-10-CM | POA: Diagnosis not present

## 2021-05-07 DIAGNOSIS — Z1152 Encounter for screening for COVID-19: Secondary | ICD-10-CM | POA: Diagnosis not present

## 2021-05-07 DIAGNOSIS — R058 Other specified cough: Secondary | ICD-10-CM | POA: Diagnosis not present

## 2021-05-07 DIAGNOSIS — J069 Acute upper respiratory infection, unspecified: Secondary | ICD-10-CM | POA: Diagnosis not present

## 2021-06-17 DIAGNOSIS — H353211 Exudative age-related macular degeneration, right eye, with active choroidal neovascularization: Secondary | ICD-10-CM | POA: Diagnosis not present

## 2021-08-12 DIAGNOSIS — H353211 Exudative age-related macular degeneration, right eye, with active choroidal neovascularization: Secondary | ICD-10-CM | POA: Diagnosis not present

## 2021-10-03 ENCOUNTER — Inpatient Hospital Stay (HOSPITAL_COMMUNITY)
Admission: EM | Admit: 2021-10-03 | Discharge: 2021-10-29 | DRG: 871 | Disposition: E | Payer: PPO | Attending: Critical Care Medicine | Admitting: Critical Care Medicine

## 2021-10-03 ENCOUNTER — Emergency Department (HOSPITAL_COMMUNITY): Payer: PPO

## 2021-10-03 ENCOUNTER — Other Ambulatory Visit: Payer: Self-pay

## 2021-10-03 DIAGNOSIS — Z66 Do not resuscitate: Secondary | ICD-10-CM | POA: Diagnosis present

## 2021-10-03 DIAGNOSIS — E860 Dehydration: Secondary | ICD-10-CM | POA: Diagnosis present

## 2021-10-03 DIAGNOSIS — R400 Somnolence: Secondary | ICD-10-CM

## 2021-10-03 DIAGNOSIS — N179 Acute kidney failure, unspecified: Secondary | ICD-10-CM | POA: Diagnosis not present

## 2021-10-03 DIAGNOSIS — J9601 Acute respiratory failure with hypoxia: Secondary | ICD-10-CM | POA: Diagnosis not present

## 2021-10-03 DIAGNOSIS — Z23 Encounter for immunization: Secondary | ICD-10-CM | POA: Diagnosis not present

## 2021-10-03 DIAGNOSIS — I4891 Unspecified atrial fibrillation: Secondary | ICD-10-CM | POA: Diagnosis present

## 2021-10-03 DIAGNOSIS — Z602 Problems related to living alone: Secondary | ICD-10-CM | POA: Diagnosis present

## 2021-10-03 DIAGNOSIS — Z853 Personal history of malignant neoplasm of breast: Secondary | ICD-10-CM

## 2021-10-03 DIAGNOSIS — R0902 Hypoxemia: Secondary | ICD-10-CM

## 2021-10-03 DIAGNOSIS — E878 Other disorders of electrolyte and fluid balance, not elsewhere classified: Secondary | ICD-10-CM | POA: Diagnosis present

## 2021-10-03 DIAGNOSIS — H9190 Unspecified hearing loss, unspecified ear: Secondary | ICD-10-CM | POA: Diagnosis present

## 2021-10-03 DIAGNOSIS — M109 Gout, unspecified: Secondary | ICD-10-CM | POA: Diagnosis present

## 2021-10-03 DIAGNOSIS — S4991XA Unspecified injury of right shoulder and upper arm, initial encounter: Secondary | ICD-10-CM | POA: Diagnosis not present

## 2021-10-03 DIAGNOSIS — I6529 Occlusion and stenosis of unspecified carotid artery: Secondary | ICD-10-CM | POA: Diagnosis not present

## 2021-10-03 DIAGNOSIS — M47816 Spondylosis without myelopathy or radiculopathy, lumbar region: Secondary | ICD-10-CM | POA: Diagnosis not present

## 2021-10-03 DIAGNOSIS — F05 Delirium due to known physiological condition: Secondary | ICD-10-CM | POA: Diagnosis present

## 2021-10-03 DIAGNOSIS — R571 Hypovolemic shock: Secondary | ICD-10-CM | POA: Diagnosis not present

## 2021-10-03 DIAGNOSIS — T68XXXA Hypothermia, initial encounter: Secondary | ICD-10-CM | POA: Diagnosis present

## 2021-10-03 DIAGNOSIS — E871 Hypo-osmolality and hyponatremia: Secondary | ICD-10-CM | POA: Diagnosis not present

## 2021-10-03 DIAGNOSIS — K219 Gastro-esophageal reflux disease without esophagitis: Secondary | ICD-10-CM | POA: Diagnosis present

## 2021-10-03 DIAGNOSIS — E43 Unspecified severe protein-calorie malnutrition: Secondary | ICD-10-CM | POA: Diagnosis present

## 2021-10-03 DIAGNOSIS — Z515 Encounter for palliative care: Secondary | ICD-10-CM | POA: Diagnosis not present

## 2021-10-03 DIAGNOSIS — S0990XA Unspecified injury of head, initial encounter: Secondary | ICD-10-CM | POA: Diagnosis not present

## 2021-10-03 DIAGNOSIS — I6381 Other cerebral infarction due to occlusion or stenosis of small artery: Secondary | ICD-10-CM | POA: Diagnosis not present

## 2021-10-03 DIAGNOSIS — E876 Hypokalemia: Secondary | ICD-10-CM | POA: Diagnosis not present

## 2021-10-03 DIAGNOSIS — K922 Gastrointestinal hemorrhage, unspecified: Secondary | ICD-10-CM | POA: Diagnosis not present

## 2021-10-03 DIAGNOSIS — T148XXA Other injury of unspecified body region, initial encounter: Secondary | ICD-10-CM | POA: Diagnosis present

## 2021-10-03 DIAGNOSIS — S59911A Unspecified injury of right forearm, initial encounter: Secondary | ICD-10-CM | POA: Diagnosis not present

## 2021-10-03 DIAGNOSIS — L89156 Pressure-induced deep tissue damage of sacral region: Secondary | ICD-10-CM | POA: Diagnosis not present

## 2021-10-03 DIAGNOSIS — S0181XA Laceration without foreign body of other part of head, initial encounter: Secondary | ICD-10-CM | POA: Diagnosis present

## 2021-10-03 DIAGNOSIS — Y92009 Unspecified place in unspecified non-institutional (private) residence as the place of occurrence of the external cause: Secondary | ICD-10-CM

## 2021-10-03 DIAGNOSIS — Z6822 Body mass index (BMI) 22.0-22.9, adult: Secondary | ICD-10-CM

## 2021-10-03 DIAGNOSIS — J690 Pneumonitis due to inhalation of food and vomit: Secondary | ICD-10-CM | POA: Diagnosis not present

## 2021-10-03 DIAGNOSIS — S300XXA Contusion of lower back and pelvis, initial encounter: Secondary | ICD-10-CM | POA: Diagnosis present

## 2021-10-03 DIAGNOSIS — Z800 Family history of malignant neoplasm of digestive organs: Secondary | ICD-10-CM

## 2021-10-03 DIAGNOSIS — K920 Hematemesis: Secondary | ICD-10-CM | POA: Diagnosis not present

## 2021-10-03 DIAGNOSIS — Z888 Allergy status to other drugs, medicaments and biological substances status: Secondary | ICD-10-CM

## 2021-10-03 DIAGNOSIS — L98499 Non-pressure chronic ulcer of skin of other sites with unspecified severity: Secondary | ICD-10-CM | POA: Diagnosis present

## 2021-10-03 DIAGNOSIS — Z801 Family history of malignant neoplasm of trachea, bronchus and lung: Secondary | ICD-10-CM

## 2021-10-03 DIAGNOSIS — W19XXXA Unspecified fall, initial encounter: Secondary | ICD-10-CM | POA: Diagnosis present

## 2021-10-03 DIAGNOSIS — R578 Other shock: Secondary | ICD-10-CM | POA: Diagnosis not present

## 2021-10-03 DIAGNOSIS — L89326 Pressure-induced deep tissue damage of left buttock: Secondary | ICD-10-CM | POA: Diagnosis not present

## 2021-10-03 DIAGNOSIS — E119 Type 2 diabetes mellitus without complications: Secondary | ICD-10-CM | POA: Diagnosis not present

## 2021-10-03 DIAGNOSIS — R579 Shock, unspecified: Secondary | ICD-10-CM | POA: Diagnosis present

## 2021-10-03 DIAGNOSIS — Z79899 Other long term (current) drug therapy: Secondary | ICD-10-CM

## 2021-10-03 DIAGNOSIS — R000 Tachycardia, unspecified: Secondary | ICD-10-CM | POA: Diagnosis not present

## 2021-10-03 DIAGNOSIS — Z7984 Long term (current) use of oral hypoglycemic drugs: Secondary | ICD-10-CM

## 2021-10-03 DIAGNOSIS — S199XXA Unspecified injury of neck, initial encounter: Secondary | ICD-10-CM | POA: Diagnosis not present

## 2021-10-03 DIAGNOSIS — D649 Anemia, unspecified: Secondary | ICD-10-CM | POA: Diagnosis not present

## 2021-10-03 DIAGNOSIS — M25711 Osteophyte, right shoulder: Secondary | ICD-10-CM | POA: Diagnosis not present

## 2021-10-03 DIAGNOSIS — M189 Osteoarthritis of first carpometacarpal joint, unspecified: Secondary | ICD-10-CM | POA: Diagnosis not present

## 2021-10-03 DIAGNOSIS — R1110 Vomiting, unspecified: Secondary | ICD-10-CM

## 2021-10-03 DIAGNOSIS — S3993XA Unspecified injury of pelvis, initial encounter: Secondary | ICD-10-CM | POA: Diagnosis not present

## 2021-10-03 DIAGNOSIS — Z9181 History of falling: Secondary | ICD-10-CM

## 2021-10-03 DIAGNOSIS — R404 Transient alteration of awareness: Secondary | ICD-10-CM | POA: Diagnosis not present

## 2021-10-03 DIAGNOSIS — A419 Sepsis, unspecified organism: Secondary | ICD-10-CM | POA: Diagnosis not present

## 2021-10-03 DIAGNOSIS — Z87891 Personal history of nicotine dependence: Secondary | ICD-10-CM

## 2021-10-03 DIAGNOSIS — I959 Hypotension, unspecified: Secondary | ICD-10-CM | POA: Diagnosis not present

## 2021-10-03 DIAGNOSIS — R64 Cachexia: Secondary | ICD-10-CM | POA: Diagnosis not present

## 2021-10-03 DIAGNOSIS — R34 Anuria and oliguria: Secondary | ICD-10-CM | POA: Diagnosis not present

## 2021-10-03 DIAGNOSIS — F458 Other somatoform disorders: Secondary | ICD-10-CM | POA: Diagnosis present

## 2021-10-03 DIAGNOSIS — T1490XA Injury, unspecified, initial encounter: Secondary | ICD-10-CM

## 2021-10-03 DIAGNOSIS — L89316 Pressure-induced deep tissue damage of right buttock: Secondary | ICD-10-CM | POA: Diagnosis present

## 2021-10-03 DIAGNOSIS — S8992XA Unspecified injury of left lower leg, initial encounter: Secondary | ICD-10-CM | POA: Diagnosis not present

## 2021-10-03 DIAGNOSIS — M6282 Rhabdomyolysis: Secondary | ICD-10-CM | POA: Diagnosis present

## 2021-10-03 DIAGNOSIS — I10 Essential (primary) hypertension: Secondary | ICD-10-CM | POA: Diagnosis present

## 2021-10-03 DIAGNOSIS — M170 Bilateral primary osteoarthritis of knee: Secondary | ICD-10-CM | POA: Diagnosis present

## 2021-10-03 DIAGNOSIS — R7401 Elevation of levels of liver transaminase levels: Secondary | ICD-10-CM | POA: Diagnosis present

## 2021-10-03 LAB — URINALYSIS, ROUTINE W REFLEX MICROSCOPIC
Bacteria, UA: NONE SEEN
Bilirubin Urine: NEGATIVE
Glucose, UA: NEGATIVE mg/dL
Ketones, ur: 5 mg/dL — AB
Leukocytes,Ua: NEGATIVE
Nitrite: NEGATIVE
Protein, ur: 100 mg/dL — AB
Specific Gravity, Urine: 1.014 (ref 1.005–1.030)
pH: 5 (ref 5.0–8.0)

## 2021-10-03 LAB — CBC WITH DIFFERENTIAL/PLATELET
Abs Immature Granulocytes: 0.29 10*3/uL — ABNORMAL HIGH (ref 0.00–0.07)
Basophils Absolute: 0 10*3/uL (ref 0.0–0.1)
Basophils Relative: 0 %
Eosinophils Absolute: 0 10*3/uL (ref 0.0–0.5)
Eosinophils Relative: 0 %
HCT: 30.2 % — ABNORMAL LOW (ref 36.0–46.0)
Hemoglobin: 10.5 g/dL — ABNORMAL LOW (ref 12.0–15.0)
Immature Granulocytes: 1 %
Lymphocytes Relative: 2 %
Lymphs Abs: 0.5 10*3/uL — ABNORMAL LOW (ref 0.7–4.0)
MCH: 31.8 pg (ref 26.0–34.0)
MCHC: 34.8 g/dL (ref 30.0–36.0)
MCV: 91.5 fL (ref 80.0–100.0)
Monocytes Absolute: 1.3 10*3/uL — ABNORMAL HIGH (ref 0.1–1.0)
Monocytes Relative: 6 %
Neutro Abs: 19.8 10*3/uL — ABNORMAL HIGH (ref 1.7–7.7)
Neutrophils Relative %: 91 %
Platelets: 329 10*3/uL (ref 150–400)
RBC: 3.3 MIL/uL — ABNORMAL LOW (ref 3.87–5.11)
RDW: 12.9 % (ref 11.5–15.5)
WBC: 21.9 10*3/uL — ABNORMAL HIGH (ref 4.0–10.5)
nRBC: 0 % (ref 0.0–0.2)

## 2021-10-03 LAB — COMPREHENSIVE METABOLIC PANEL
ALT: 73 U/L — ABNORMAL HIGH (ref 0–44)
AST: 116 U/L — ABNORMAL HIGH (ref 15–41)
Albumin: 2.7 g/dL — ABNORMAL LOW (ref 3.5–5.0)
Alkaline Phosphatase: 66 U/L (ref 38–126)
Anion gap: 14 (ref 5–15)
BUN: 99 mg/dL — ABNORMAL HIGH (ref 8–23)
CO2: 22 mmol/L (ref 22–32)
Calcium: 10.5 mg/dL — ABNORMAL HIGH (ref 8.9–10.3)
Chloride: 95 mmol/L — ABNORMAL LOW (ref 98–111)
Creatinine, Ser: 2.19 mg/dL — ABNORMAL HIGH (ref 0.44–1.00)
GFR, Estimated: 21 mL/min — ABNORMAL LOW (ref >60)
Glucose, Bld: 146 mg/dL — ABNORMAL HIGH (ref 70–99)
Potassium: 3.8 mmol/L (ref 3.5–5.1)
Sodium: 131 mmol/L — ABNORMAL LOW (ref 135–145)
Total Bilirubin: 0.7 mg/dL (ref 0.3–1.2)
Total Protein: 6 g/dL — ABNORMAL LOW (ref 6.5–8.1)

## 2021-10-03 LAB — PROTIME-INR
INR: 1.3 — ABNORMAL HIGH (ref 0.8–1.2)
Prothrombin Time: 16 seconds — ABNORMAL HIGH (ref 11.4–15.2)

## 2021-10-03 LAB — I-STAT VENOUS BLOOD GAS, ED
Acid-base deficit: 2 mmol/L (ref 0.0–2.0)
Bicarbonate: 23.6 mmol/L (ref 20.0–28.0)
Calcium, Ion: 1.32 mmol/L (ref 1.15–1.40)
HCT: 32 % — ABNORMAL LOW (ref 36.0–46.0)
Hemoglobin: 10.9 g/dL — ABNORMAL LOW (ref 12.0–15.0)
O2 Saturation: 84 %
Potassium: 3.8 mmol/L (ref 3.5–5.1)
Sodium: 129 mmol/L — ABNORMAL LOW (ref 135–145)
TCO2: 25 mmol/L (ref 22–32)
pCO2, Ven: 41.7 mmHg — ABNORMAL LOW (ref 44.0–60.0)
pH, Ven: 7.36 (ref 7.250–7.430)
pO2, Ven: 51 mmHg — ABNORMAL HIGH (ref 32.0–45.0)

## 2021-10-03 LAB — TROPONIN I (HIGH SENSITIVITY)
Troponin I (High Sensitivity): 1088 ng/L (ref <18)
Troponin I (High Sensitivity): 941 ng/L (ref <18)

## 2021-10-03 LAB — APTT: aPTT: 39 seconds — ABNORMAL HIGH (ref 24–36)

## 2021-10-03 LAB — CK: Total CK: 2841 U/L — ABNORMAL HIGH (ref 38–234)

## 2021-10-03 LAB — CBG MONITORING, ED: Glucose-Capillary: 164 mg/dL — ABNORMAL HIGH (ref 70–99)

## 2021-10-03 LAB — MAGNESIUM: Magnesium: 3.1 mg/dL — ABNORMAL HIGH (ref 1.7–2.4)

## 2021-10-03 LAB — LACTIC ACID, PLASMA: Lactic Acid, Venous: 1.7 mmol/L (ref 0.5–1.9)

## 2021-10-03 MED ORDER — LACTATED RINGERS IV BOLUS
1000.0000 mL | Freq: Once | INTRAVENOUS | Status: AC
  Administered 2021-10-03: 1000 mL via INTRAVENOUS

## 2021-10-03 MED ORDER — TETANUS-DIPHTH-ACELL PERTUSSIS 5-2.5-18.5 LF-MCG/0.5 IM SUSY
0.5000 mL | PREFILLED_SYRINGE | Freq: Once | INTRAMUSCULAR | Status: AC
  Administered 2021-10-03: 0.5 mL via INTRAMUSCULAR
  Filled 2021-10-03: qty 0.5

## 2021-10-03 MED ORDER — SODIUM CHLORIDE 0.9 % IV SOLN: Freq: Once | INTRAVENOUS | Status: AC

## 2021-10-03 MED ORDER — DILTIAZEM HCL 25 MG/5ML IV SOLN: 10.0000 mg | Freq: Once | INTRAVENOUS | Status: DC

## 2021-10-03 MED ORDER — LACTATED RINGERS IV SOLN: INTRAVENOUS | Status: DC

## 2021-10-03 NOTE — ED Notes (Signed)
Patient transported to CT 

## 2021-10-03 NOTE — ED Notes (Signed)
Multiple pressure injuries noted upon skin assessment.

## 2021-10-03 NOTE — ED Notes (Addendum)
X-ray informed that pt is currently in CT. X-ray will take pt after CT is finished.

## 2021-10-03 NOTE — ED Notes (Signed)
Spoke with EDP Dr Sabra Heck, BP 73/53 and cardizem placed on hold.

## 2021-10-03 NOTE — ED Triage Notes (Addendum)
Pt BIB EMS from home w/ AMS. Family had not seen or heard from her since 10/31. Pt lives alone. Upon checking up on her they found her lying in between her bed and the wall. Initially EMS was not able to get her to respond. EMS noted swelling to the head and a laceration to her R temporal.   EMS had pt on 15L of O2 on a nonrebreather d/t an O2 at 70%. Pt is not on O2 at baseline.  Pt. has a history of AFIB.   VSS with EMS except for low O2 and afib rhythm.

## 2021-10-03 NOTE — ED Notes (Addendum)
Multiple pressure injuries as follows: Right shoulder, Left buttocks (stage 2), bilateral knees, right second toe.   Multiple abrasions and redness to the left side.   2cm forehead laceration.

## 2021-10-03 NOTE — ED Provider Notes (Signed)
[facility] EMERGENCY DEPARTMENT Provider Note   CSN: [id number] Arrival date & time: 10/24/2021  1746     History Chief Complaint  Patient presents with   Altered Mental Status    Kelly Webster is a 87 y.o. female.   Altered Mental Status  87 year old female with PMH significant for HTN, arthritis, and diabetes who presents to the ED via EMS after fall.  Patient was reportedly at her normal baseline on Monday, when her son talked to her on the phone.  Neighbors became concerned because they had not seen her in some time, and entered the home to find the patient face down next to her bed.  She was somnolent and confused.  EMS was called, who found the patient to be hypotensive and hypoxic, and covered in urine and feces.  Patient was transferred to this ED and given 1 L crystalloid bolus.  On arrival, the patient is confused and unable to provide further history.  Patient's son presented to the bedside shortly after arrival, who states that she is normally able to converse and is oriented and alert at baseline.  She lives alone and can ambulate and prepare a meal for herself normally.  She has become progressively weak and has had falls intermittently.  She does not use a blood thinner per his knowledge.   Past Medical History:  Diagnosis Date   Arthritis    gouty arthritis knees and hands   Diabetes mellitus without complication ([place])    Hx of seasonal allergies    Hypertension     Patient Active Problem List   Diagnosis Date Noted   Shock ([place]) 10/04/2021   Atrial fibrillation with rapid ventricular response ([place]) 10/18/2021   HTN (hypertension) 07/16/2015   Diabetes mellitus without complication ([place]) 63/84/5364   Altered mental status 07/16/2015   Altered mental state 07/16/2015   Cognitive and behavioral changes    Delirium    Breast cancer of upper-outer quadrant of right female breast ([place]) 06/22/2013    Past Surgical History:  Procedure  Laterality Date   ABDOMINAL HYSTERECTOMY     1981   APPENDECTOMY     1967   BREAST BIOPSY     BREAST LUMPECTOMY     right 2014   BREAST LUMPECTOMY WITH NEEDLE LOCALIZATION Right 07/27/2013   Procedure: BREAST LUMPECTOMY WITH NEEDLE LOCALIZATION;  Surgeon: Edward Jolly, MD;  Location: [place];  Service: General;  Laterality: Right;   CARPAL TUNNEL RELEASE Left    [place]     OB History   No obstetric history on file.     Family History  Problem Relation Age of Onset   Colon cancer Sister    Colon cancer Maternal Uncle    Lung cancer Maternal Uncle    Bone cancer Cousin     Social History   Tobacco Use   Smoking status: Former    Packs/day: 0.25    Types: Cigarettes    Quit date: 08/28/1980    Years since quitting: 41.1  Substance Use Topics   Alcohol use: No   Drug use: No    Home Medications Prior to Admission medications   Medication Sig Start Date End Date Taking? Authorizing Provider  allopurinol (ZYLOPRIM) 300 MG tablet Take 300 mg by mouth daily.   Yes [provider]  ALPRAZolam (NIRAVAM) 0.25 MG dissolvable tablet Take 0.25 mg by mouth at bedtime as needed for  anxiety.   Yes [provider]  Ascorbic Acid (VITAMIN C) 1000 MG tablet Take 1,000 mg by mouth 2 (two) times daily.    Yes [provider]  colchicine 0.6 MG tablet Take 0.6 mg by mouth daily.   Yes [provider]  ibuprofen (ADVIL,MOTRIN) 200 MG tablet Take 200 mg by mouth every 6 (six) hours as needed for pain.   Yes [provider]  loratadine (CLARITIN) 10 MG tablet Take 10 mg by mouth daily.   Yes [provider]  losartan-hydrochlorothiazide (HYZAAR) 50-12.5 MG per tablet Take 0.5 tablets by mouth daily.   Yes [provider]  metFORMIN (GLUCOPHAGE) 500 MG tablet Take 500 mg by mouth daily.    Yes [provider]  Multiple Vitamin (MULTI-VITAMIN PO) Take 1 tablet by  mouth daily.   Yes [provider]  omeprazole (PRILOSEC) 20 MG capsule Take 20 mg by mouth daily.   Yes [provider]  QUEtiapine (SEROQUEL) 25 MG tablet Take 1 tablet (25 mg total) by mouth at bedtime. Patient taking differently: Take 12.5 mg by mouth at bedtime. 07/21/15  Yes Rai, Ripudeep K, MD  Vitamin E 400 UNITS TABS Take 400 Units by mouth daily.    Yes [provider]  folic acid (FOLVITE) 1 MG tablet Take 1 tablet (1 mg total) by mouth daily. Patient not taking: Reported on 10/01/2021 07/21/15   Rai, Vernelle Emerald, MD  traMADol (ULTRAM) 50 MG tablet Take 1 tablet (50 mg total) by mouth every 8 (eight) hours as needed for severe pain. Patient not taking: Reported on 09/30/2021 07/23/15   Mendel Corning, MD    Allergies    Demerol [meperidine]  Review of Systems   Review of Systems  Unable to perform ROS: Mental status change   Physical Exam Updated Vital Signs BP (!) 79/43   Pulse (!) 142   Temp (!) 96.5 F (35.8 C) (Rectal)   Resp 17   Ht 4\' 9"  (1.448 m)   Wt 60.3 kg   SpO2 96%   BMI 28.78 kg/m   Physical Exam Vitals and nursing note reviewed.  Constitutional:      General: She is not in acute distress.    Appearance: She is cachectic. She is ill-appearing. She is not toxic-appearing or diaphoretic.     Interventions: Cervical collar in place.  HENT:     Head: Normocephalic and atraumatic. No right periorbital erythema or left periorbital erythema.     Jaw: There is normal jaw occlusion.      Comments: 2cm old-appearing, dry laceration to right forehead    Right Ear: External ear normal.     Left Ear: External ear normal.     Nose: Nose normal.     Right Nostril: No epistaxis or septal hematoma.     Left Nostril: No epistaxis or septal hematoma.     Mouth/Throat:     Lips: Pink.     Mouth: Mucous membranes are dry.     Pharynx: Oropharynx is clear. Uvula midline.  Eyes:     General: Vision grossly intact. No scleral icterus.     Extraocular Movements: Extraocular movements intact.     Pupils: Pupils are equal, round, and reactive to light.     Comments: Pupils 32mm  Neck:     Vascular: No JVD.     Trachea: Trachea and phonation normal.  Cardiovascular:     Rate and Rhythm: Regular rhythm. Tachycardia present.     Pulses:  Normal pulses.     Heart sounds: Normal heart sounds. No murmur heard.    Comments: Cool extremities Pulmonary:     Effort: Pulmonary effort is normal. No respiratory distress.     Breath sounds: Normal breath sounds and air entry.  Abdominal:     General: Abdomen is flat. There is no distension.     Palpations: Abdomen is soft.     Tenderness: There is no abdominal tenderness. There is no guarding or rebound.  Musculoskeletal:        General: Signs of injury present.     Cervical back: Neck supple. No rigidity, tenderness or bony tenderness. No spinous process tenderness or muscular tenderness. Decreased range of motion.     Thoracic back: No tenderness or bony tenderness. Decreased range of motion.     Lumbar back: No tenderness or bony tenderness. Decreased range of motion.  Lymphadenopathy:     Cervical: No cervical adenopathy.  Skin:    Capillary Refill: Capillary refill takes more than 3 seconds.     Findings: Abrasion, bruising, ecchymosis and erythema present.          Comments: Multiple abrasions and age 43 and stage II pressure injuries as above  Neurological:     Mental Status: She is easily aroused. She is lethargic.     GCS: GCS eye subscore is 4. GCS verbal subscore is 4. GCS motor subscore is 5.     Cranial Nerves: Cranial nerves 2-12 are intact. No dysarthria or facial asymmetry.     Sensory: Sensation is intact.     Motor: Weakness and atrophy present. No tremor, abnormal muscle tone or seizure activity.     Comments: Patient confused and somnolent.  Unable to participate in neurologic exam, but no obvious focal deficits.  Moving all extremities spontaneously.   Intermittently staring and not responsive to questions    ED Results / Procedures / Treatments   Labs (all labs ordered are listed, but only abnormal results are displayed) Labs Reviewed  COMPREHENSIVE METABOLIC PANEL - Abnormal; Notable for the following components:      Result Value   Sodium 131 (*)    Chloride 95 (*)    Glucose, Bld 146 (*)    BUN 99 (*)    Creatinine, Ser 2.19 (*)    Calcium 10.5 (*)    Total Protein 6.0 (*)    Albumin 2.7 (*)    AST 116 (*)    ALT 73 (*)    GFR, Estimated 21 (*)    All other components within normal limits  CBC WITH DIFFERENTIAL/PLATELET - Abnormal; Notable for the following components:   WBC 21.9 (*)    RBC 3.30 (*)    Hemoglobin 10.5 (*)    HCT 30.2 (*)    Neutro Abs 19.8 (*)    Lymphs Abs 0.5 (*)    Monocytes Absolute 1.3 (*)    Abs Immature Granulocytes 0.29 (*)    All other components within normal limits  PROTIME-INR - Abnormal; Notable for the following components:   Prothrombin Time 16.0 (*)    INR 1.3 (*)    All other components within normal limits  APTT - Abnormal; Notable for the following components:   aPTT 39 (*)    All other components within normal limits  URINALYSIS, ROUTINE W REFLEX MICROSCOPIC - Abnormal; Notable for the following components:   Color, Urine AMBER (*)    Hgb urine dipstick SMALL (*)    Ketones, ur 5 (*)  Protein, ur 100 (*)    All other components within normal limits  CK - Abnormal; Notable for the following components:   Total CK 2,841 (*)    All other components within normal limits  MAGNESIUM - Abnormal; Notable for the following components:   Magnesium 3.1 (*)    All other components within normal limits  I-STAT VENOUS BLOOD GAS, ED - Abnormal; Notable for the following components:   pCO2, Ven 41.7 (*)    pO2, Ven 51.0 (*)    Sodium 129 (*)    HCT 32.0 (*)    Hemoglobin 10.9 (*)    All other components within normal limits  CBG MONITORING, ED - Abnormal; Notable for the following  components:   Glucose-Capillary 164 (*)    All other components within normal limits  TROPONIN I (HIGH SENSITIVITY) - Abnormal; Notable for the following components:   Troponin I (High Sensitivity) 1,088 (*)    All other components within normal limits  TROPONIN I (HIGH SENSITIVITY) - Abnormal; Notable for the following components:   Troponin I (High Sensitivity) 941 (*)    All other components within normal limits  CULTURE, BLOOD (ROUTINE X 2)  CULTURE, BLOOD (ROUTINE X 2)  URINE CULTURE  LACTIC ACID, PLASMA  LACTIC ACID, PLASMA  BASIC METABOLIC PANEL    EKG EKG Interpretation  Date/Time:  Saturday October 03 2021 18:18:14 EDT Ventricular Rate:  134 PR Interval:    QRS Duration: 114 QT Interval:  336 QTC Calculation: 502 R Axis:   104 Text Interpretation: Atrial fibrillation Borderline intraventricular conduction delay Minimal ST elevation, inferior leads Prolonged QT interval slince last EKG - now with A fib Confirmed by Noemi Chapel [phone]) on 10/11/2021 9:15:47 PM  Radiology DG Pelvis 1-2 Views  Result Date: 10/26/2021 CLINICAL DATA:  Trauma.  Patient unresponsive. EXAM: PELVIS - 1-2 VIEW COMPARISON:  None. FINDINGS: No fractures are identified. IMPRESSION: Negative. Electronically Signed   By: Dorise Bullion III M.D.   On: 10/28/2021 19:48   DG Tibia/Fibula Left  Result Date: 10/15/2021 CLINICAL DATA:  Trauma.  Patient is unresponsive. EXAM: LEFT TIBIA AND FIBULA - 2 VIEW COMPARISON:  None. FINDINGS: There is no evidence of fracture or other focal bone lesions. Soft tissues are unremarkable. IMPRESSION: Negative. Electronically Signed   By: Dorise Bullion III M.D.   On: 09/30/2021 19:49   CT Head Wo Contrast  Result Date: 10/09/2021 CLINICAL DATA:  Trauma.  Patient found down. EXAM: CT HEAD WITHOUT CONTRAST CT CERVICAL SPINE WITHOUT CONTRAST TECHNIQUE: Multidetector CT imaging of the head and cervical spine was performed following the standard protocol without  intravenous contrast. Multiplanar CT image reconstructions of the cervical spine were also generated. COMPARISON:  July 14, 2015 FINDINGS: CT HEAD FINDINGS Brain: No subdural, epidural, or subarachnoid hemorrhage. Cerebellum, brainstem, and basal cisterns are normal. A lacunar infarct is seen in the left caudate, new since 2016 but nonacute appearance. Scattered white matter changes are identified. A right frontal lobe infarct is identified, not present 2016 but suspected to be nonacute. No other infarct or ischemia. Ventricles and sulci are prominent but stable. No mass effect or midline shift. Vascular: Calcified atherosclerosis in the intracranial carotids. Skull: Normal. Negative for fracture or focal lesion. Sinuses/Orbits: No acute finding. Other: None. CT CERVICAL SPINE FINDINGS Alignment: Trace anterolisthesis of C4 versus C5. No other malalignment. Skull base and vertebrae: No acute fracture. No primary bone lesion or focal pathologic process. Soft tissues and spinal canal: No prevertebral fluid or swelling.  No visible canal hematoma. Disc levels: Multilevel degenerative disc disease and facet degenerative changes. Upper chest: Negative. Other: No other abnormalities. IMPRESSION: 1. There is a small right frontal infarct which is age indeterminate but favored to be nonacute. A remote left caudate infarct is noted as well. No other acute abnormalities. 2. No fracture or traumatic malalignment in the cervical spine. Multilevel degenerative changes. Electronically Signed   By: Dorise Bullion III M.D.   On: 10/16/2021 19:59   CT Cervical Spine Wo Contrast  Result Date: 10/20/2021 CLINICAL DATA:  Trauma.  Patient found down. EXAM: CT HEAD WITHOUT CONTRAST CT CERVICAL SPINE WITHOUT CONTRAST TECHNIQUE: Multidetector CT imaging of the head and cervical spine was performed following the standard protocol without intravenous contrast. Multiplanar CT image reconstructions of the cervical spine were also  generated. COMPARISON:  July 14, 2015 FINDINGS: CT HEAD FINDINGS Brain: No subdural, epidural, or subarachnoid hemorrhage. Cerebellum, brainstem, and basal cisterns are normal. A lacunar infarct is seen in the left caudate, new since 2016 but nonacute appearance. Scattered white matter changes are identified. A right frontal lobe infarct is identified, not present 2016 but suspected to be nonacute. No other infarct or ischemia. Ventricles and sulci are prominent but stable. No mass effect or midline shift. Vascular: Calcified atherosclerosis in the intracranial carotids. Skull: Normal. Negative for fracture or focal lesion. Sinuses/Orbits: No acute finding. Other: None. CT CERVICAL SPINE FINDINGS Alignment: Trace anterolisthesis of C4 versus C5. No other malalignment. Skull base and vertebrae: No acute fracture. No primary bone lesion or focal pathologic process. Soft tissues and spinal canal: No prevertebral fluid or swelling. No visible canal hematoma. Disc levels: Multilevel degenerative disc disease and facet degenerative changes. Upper chest: Negative. Other: No other abnormalities. IMPRESSION: 1. There is a small right frontal infarct which is age indeterminate but favored to be nonacute. A remote left caudate infarct is noted as well. No other acute abnormalities. 2. No fracture or traumatic malalignment in the cervical spine. Multilevel degenerative changes. Electronically Signed   By: Dorise Bullion III M.D.   On: 10/09/2021 19:59   DG Chest Port 1 View  Result Date: 10/05/2021 CLINICAL DATA:  Questionable sepsis. History of atrial fibrillation. EXAM: PORTABLE CHEST 1 VIEW COMPARISON:  July 14, 2015 FINDINGS: There is a prominent skin fold over the right lower chest. No pneumothorax. The heart, hila, mediastinum are normal. No pulmonary nodules or masses. No focal infiltrates. IMPRESSION: No acute abnormalities.  No cause for sepsis identified. Electronically Signed   By: Dorise Bullion III M.D.    On: 10/10/2021 19:47   DG Humerus Right  Result Date: 10/14/2021 CLINICAL DATA:  Trauma. EXAM: RIGHT HUMERUS - 2+ VIEW COMPARISON:  None. FINDINGS: Limited views of the right chest are normal. AC joint degenerative changes. Limited views of the scapula and clavicle are otherwise normal. No humeral fracture. The proximal ulna is unremarkable on limited views. Irregularity of the radial head may represent osteophytes. If there is pain in this region, recommend dedicated images of the elbow. IMPRESSION: 1. Irregularity of the radial head may represent osteophytes. If there is pain in this region, recommend dedicated images of the elbow to exclude a radial head fracture. 2. No other abnormalities. Electronically Signed   By: Dorise Bullion III M.D.   On: 10/27/2021 19:46    Procedures Procedures   Medications Ordered in ED Medications  lactated ringers infusion ( Intravenous New Bag/Given 10/09/2021 1933)  diltiazem (CARDIZEM) injection 10 mg (0 mg Intravenous Hold 10/14/2021 2207)  norepinephrine (LEVOPHED) 4mg  in 270mL premix infusion (4 mcg/min Intravenous New Bag/Given 10/04/21 0022)  docusate sodium (COLACE) capsule 100 mg (has no administration in time range)  polyethylene glycol (MIRALAX / GLYCOLAX) packet 17 g (has no administration in time range)  heparin injection 5,000 Units (has no administration in time range)  ceFEPIme (MAXIPIME) 2 g in sodium chloride 0.9 % 100 mL IVPB (has no administration in time range)  vancomycin (VANCOREADY) IVPB 1250 mg/250 mL (has no administration in time range)  0.9 %  sodium chloride infusion (0 mLs Intravenous Stopped 10/11/2021 2022)  Tdap (BOOSTRIX) injection 0.5 mL (0.5 mLs Intramuscular Given 10/21/2021 1931)  lactated ringers bolus 1,000 mL (0 mLs Intravenous Stopped 10/02/2021 2115)  lactated ringers bolus 1,000 mL (0 mLs Intravenous Stopped 10/04/2021 2320)  lactated ringers bolus 1,000 mL (0 mLs Intravenous Stopped 10/04/21 0013)    ED Course  I have reviewed  the triage vital signs and the nursing notes.  Pertinent labs & imaging results that were available during my care of the patient were reviewed by me and considered in my medical decision making (see chart for details).    MDM Rules/Calculators/A&P                          Kelly Webster is a 87 y.o. female presenting with AMS. Initial VS sig for hypotension, hypothermia and tachycardia.  BGL 164 on arrival.  Protecting airway with no immediate life threats.  Warm blankets placed.  Nonhypoxic on room air with appropriate waveform.  EKG interpretation: Atrial fibrillation with RVR, prolonged QT interval, rate 134 bpm, ST elevation in V3, partial RBBB, nonspecific T wave changes in V2 and aVF, and V6.  T wave changes and QT interval lengthening new from prior.  Atrial fibrillation known per family.  Labs: Elevated troponin, downtrending after fluids.  Initial lactic acidosis, improving after fluids.  Elevated CK at 2841.  VBG WNL.  Blood and urine cultures obtained.  Repeat BMP pending, initial CMP notable for hyponatremia, hypochloremia, elevated BUN and creatinine from prior baseline, transaminitis, normal T bili and normal anion gap.  Leukocytosis with left shift and mild normocytic anemia near prior baseline.  PT/INR/APTT elevated.  UA unremarkable.   Imaging: CT head and CT C-spine without acute traumatic or pathologic process.  Extremity x-rays negative for acute fractures.  Bedside ultrasound revealed significantly collapsible IVC, persistently present even after fluids.  No pericardial effusion or tamponade physiology identified.  Imaging was reviewed by radiology and personally by me.  DDX considered: Sepsis, bacteremia, fracture, dislocation, UTI, acute renal failure, intracranial bleed, skull fracture, vertebral injury, CVA, hemorrhagic stroke. History, examination, and objective data most consistent with rhabdomyolysis and hypovolemia.  No infectious etiology identified.  No traumatic  findings other than laceration to suspect other regions of erythema and bruising represent stage I and stage II pressure injuries consistent with prolonged downtime.  UA inconsistent with infectious process.  Patient is fluid responsive with improving lab abnormalities after resuscitation.  Neuro exam nonfocal, low suspicion for CVA at this time.  Patient would be outside tPA window given unknown last known normal.  Medications: Medications  lactated ringers infusion ( Intravenous New Bag/Given 09/29/2021 1933)  diltiazem (CARDIZEM) injection 10 mg (0 mg Intravenous Hold 10/01/2021 2207)  norepinephrine (LEVOPHED) 4mg  in 239mL premix infusion (4 mcg/min Intravenous New Bag/Given 10/04/21 0022)  docusate sodium (COLACE) capsule 100 mg (has no administration in time range)  polyethylene glycol (MIRALAX / GLYCOLAX) packet  17 g (has no administration in time range)  heparin injection 5,000 Units (has no administration in time range)  ceFEPIme (MAXIPIME) 2 g in sodium chloride 0.9 % 100 mL IVPB (has no administration in time range)  vancomycin (VANCOREADY) IVPB 1250 mg/250 mL (has no administration in time range)  0.9 %  sodium chloride infusion (0 mLs Intravenous Stopped 10/20/2021 2022)  Tdap (BOOSTRIX) injection 0.5 mL (0.5 mLs Intramuscular Given 10/25/2021 1931)  lactated ringers bolus 1,000 mL (0 mLs Intravenous Stopped 09/29/2021 2115)  lactated ringers bolus 1,000 mL (0 mLs Intravenous Stopped 10/20/2021 2320)  lactated ringers bolus 1,000 mL (0 mLs Intravenous Stopped 10/04/21 0013)    Laceration does not require repair, given duration and old appearance.  Will require line by secondary intention.  No antibiotics or sepsis protocol initiated on arrival, exam and history consistent with hypovolemia.  Re-evaluated prior to admission.  Maps above 65, mental status unchanged.  Autism ordered for rate control with atrial fibrillation, held due to patient's tenuous BP.  Initially planned for hospitalist admission.   Patient became increasingly hypotensive despite 4 L crystalloid boluses.  Patient will be retriaged to ICU.  Levophed drip initiated.  Broad-spectrum antibiotics initiated.  Admitted to ICU in critical condition.  Further management per accepting team.  MIVF infusion in process.  Patient's son updated multiple times at the bedside with the plan.  Or stands and agrees with the plan.  Reportedly patient has a living well, and he will bring the paperwork to provide to the nurses.  As of now, the patient is full code after prolonged discussion with patient's son unless other documentation is provided signifying otherwise.   The plan for this patient was discussed with my attending physician, who voiced agreement and who oversaw evaluation and treatment of this patient.     Note: Estate manager/land agent was used in the creation of this note.  Final Clinical Impression(s) / ED Diagnoses Final diagnoses:  Somnolence  Dehydration  Non-traumatic rhabdomyolysis    Rx / DC Orders ED Discharge Orders     None        Cherly Hensen, DO 10/04/21 [id number]    Noemi Chapel, MD 10/09/21 1451

## 2021-10-03 NOTE — ED Provider Notes (Signed)
I saw and evaluated the patient, reviewed the resident's note and I agree with the findings and plan.  Pertinent History: This is an ill-appearing 87 year old female presenting from home, she has a known history of diabetes on metformin, she has a history of hypertension, history of arthritis, she lives by herself in [place], she was found on the ground by family members today, it is not clear how long she had been there, they last saw her on Monday and now 6 days later they could not get a hold of her on the phone, they went to the house and found her lodged between the bed and the wall, multiple pressure sores to the bilateral face as well as the right shoulder, she was tachycardic in atrial fibrillation, she was hypoxic, she was cold to the touch and likely hypothermic.  I was personally present and directly supervised the following procedures:  Medical evaluation Resuscitation Evaluation for sources of patient's weakness including hypothermia electrolyte disturbance renal failure rhabdomyolysis, stroke, cardiac cause, infectious cause  I personally interpreted the EKG as well as the resident and agree with the interpretation on the resident's chart.  .Critical Care Performed by: Noemi Chapel, MD Authorized by: Noemi Chapel, MD   Critical care provider statement:    Critical care time (minutes):  45   Critical care time was exclusive of:  Separately billable procedures and treating other patients and teaching time   Critical care was necessary to treat or prevent imminent or life-threatening deterioration of the following conditions:  Cardiac failure   Critical care was time spent personally by me on the following activities:  Blood draw for specimens, development of treatment plan with patient or surrogate, discussions with consultants, evaluation of patient's response to treatment, examination of patient, obtaining history from patient or surrogate, review of old charts, re-evaluation  of patient's condition, pulse oximetry, ordering and review of radiographic studies, ordering and review of laboratory studies and ordering and performing treatments and interventions   I assumed direction of critical care for this patient from another provider in my specialty: no     Care discussed with: admitting provider     Final diagnoses:  Somnolence  Dehydration  Non-traumatic rhabdomyolysis  Emesis  Hypoxia      Noemi Chapel, MD 10/09/21 1451

## 2021-10-04 ENCOUNTER — Inpatient Hospital Stay (HOSPITAL_COMMUNITY): Payer: PPO

## 2021-10-04 DIAGNOSIS — M189 Osteoarthritis of first carpometacarpal joint, unspecified: Secondary | ICD-10-CM | POA: Diagnosis not present

## 2021-10-04 DIAGNOSIS — I4891 Unspecified atrial fibrillation: Secondary | ICD-10-CM

## 2021-10-04 DIAGNOSIS — S59911A Unspecified injury of right forearm, initial encounter: Secondary | ICD-10-CM | POA: Diagnosis not present

## 2021-10-04 DIAGNOSIS — E860 Dehydration: Secondary | ICD-10-CM | POA: Diagnosis not present

## 2021-10-04 DIAGNOSIS — R579 Shock, unspecified: Secondary | ICD-10-CM | POA: Diagnosis not present

## 2021-10-04 DIAGNOSIS — M25711 Osteophyte, right shoulder: Secondary | ICD-10-CM | POA: Diagnosis not present

## 2021-10-04 DIAGNOSIS — M6282 Rhabdomyolysis: Secondary | ICD-10-CM

## 2021-10-04 DIAGNOSIS — T1490XA Injury, unspecified, initial encounter: Secondary | ICD-10-CM

## 2021-10-04 DIAGNOSIS — S4991XA Unspecified injury of right shoulder and upper arm, initial encounter: Secondary | ICD-10-CM | POA: Diagnosis not present

## 2021-10-04 LAB — HEPARIN LEVEL (UNFRACTIONATED)
Heparin Unfractionated: <0.1 IU/mL — ABNORMAL LOW (ref 0.30–0.70)
Heparin Unfractionated: <0.1 IU/mL — ABNORMAL LOW (ref 0.30–0.70)

## 2021-10-04 LAB — BASIC METABOLIC PANEL
Anion gap: 9 (ref 5–15)
Anion gap: 9 (ref 5–15)
BUN: 91 mg/dL — ABNORMAL HIGH (ref 8–23)
BUN: 74 mg/dL — ABNORMAL HIGH (ref 8–23)
CO2: 20 mmol/L — ABNORMAL LOW (ref 22–32)
CO2: 24 mmol/L (ref 22–32)
Calcium: 9 mg/dL (ref 8.9–10.3)
Calcium: 10 mg/dL (ref 8.9–10.3)
Chloride: 100 mmol/L (ref 98–111)
Chloride: 99 mmol/L (ref 98–111)
Creatinine, Ser: 1.72 mg/dL — ABNORMAL HIGH (ref 0.44–1.00)
Creatinine, Ser: 1.31 mg/dL — ABNORMAL HIGH (ref 0.44–1.00)
GFR, Estimated: 28 mL/min — ABNORMAL LOW (ref >60)
GFR, Estimated: 39 mL/min — ABNORMAL LOW (ref >60)
Glucose, Bld: 119 mg/dL — ABNORMAL HIGH (ref 70–99)
Glucose, Bld: 149 mg/dL — ABNORMAL HIGH (ref 70–99)
Potassium: 3.8 mmol/L (ref 3.5–5.1)
Potassium: 3.3 mmol/L — ABNORMAL LOW (ref 3.5–5.1)
Sodium: 129 mmol/L — ABNORMAL LOW (ref 135–145)
Sodium: 132 mmol/L — ABNORMAL LOW (ref 135–145)

## 2021-10-04 LAB — PHOSPHORUS: Phosphorus: 3.2 mg/dL (ref 2.5–4.6)

## 2021-10-04 LAB — GLUCOSE, CAPILLARY
Glucose-Capillary: 110 mg/dL — ABNORMAL HIGH (ref 70–99)
Glucose-Capillary: 160 mg/dL — ABNORMAL HIGH (ref 70–99)
Glucose-Capillary: 149 mg/dL — ABNORMAL HIGH (ref 70–99)
Glucose-Capillary: 136 mg/dL — ABNORMAL HIGH (ref 70–99)
Glucose-Capillary: 116 mg/dL — ABNORMAL HIGH (ref 70–99)
Glucose-Capillary: 136 mg/dL — ABNORMAL HIGH (ref 70–99)
Glucose-Capillary: 128 mg/dL — ABNORMAL HIGH (ref 70–99)

## 2021-10-04 LAB — HEMOGLOBIN A1C
Hgb A1c MFr Bld: 5.7 % — ABNORMAL HIGH (ref 4.8–5.6)
Mean Plasma Glucose: 116.89 mg/dL

## 2021-10-04 LAB — CK: Total CK: 2189 U/L — ABNORMAL HIGH (ref 38–234)

## 2021-10-04 LAB — MAGNESIUM
Magnesium: 2.5 mg/dL — ABNORMAL HIGH (ref 1.7–2.4)
Magnesium: 2.3 mg/dL (ref 1.7–2.4)

## 2021-10-04 LAB — MRSA NEXT GEN BY PCR, NASAL: MRSA by PCR Next Gen: NOT DETECTED

## 2021-10-04 LAB — LACTIC ACID, PLASMA: Lactic Acid, Venous: 1 mmol/L (ref 0.5–1.9)

## 2021-10-04 MED ORDER — VANCOMYCIN HCL 750 MG/150ML IV SOLN
750.0000 mg | INTRAVENOUS | Status: DC
Start: 2021-10-06 — End: 2021-10-05

## 2021-10-04 MED ORDER — AMIODARONE HCL IN DEXTROSE 360-4.14 MG/200ML-% IV SOLN
30.0000 mg/h | INTRAVENOUS | Status: DC
  Administered 2021-10-04 – 2021-10-05 (×4): 30 mg/h via INTRAVENOUS
  Filled 2021-10-04 (×4): qty 200

## 2021-10-04 MED ORDER — DOCUSATE SODIUM 100 MG PO CAPS: 100.0000 mg | ORAL_CAPSULE | Freq: Two times a day (BID) | ORAL | Status: DC | PRN

## 2021-10-04 MED ORDER — AMIODARONE HCL IN DEXTROSE 360-4.14 MG/200ML-% IV SOLN
60.0000 mg/h | INTRAVENOUS | Status: AC
  Administered 2021-10-04 (×2): 60 mg/h via INTRAVENOUS
  Filled 2021-10-04: qty 200

## 2021-10-04 MED ORDER — POTASSIUM CHLORIDE 10 MEQ/100ML IV SOLN
10.0000 meq | INTRAVENOUS | Status: AC
  Administered 2021-10-04 (×2): 10 meq via INTRAVENOUS
  Filled 2021-10-04 (×2): qty 100

## 2021-10-04 MED ORDER — POLYETHYLENE GLYCOL 3350 17 G PO PACK: 17.0000 g | PACK | Freq: Every day | ORAL | Status: DC | PRN

## 2021-10-04 MED ORDER — NOREPINEPHRINE 4 MG/250ML-% IV SOLN
0.0000 ug/min | INTRAVENOUS | Status: DC
  Administered 2021-10-04: 4 ug/min via INTRAVENOUS
  Filled 2021-10-04: qty 250

## 2021-10-04 MED ORDER — INSULIN ASPART 100 UNIT/ML IJ SOLN
0.0000 [IU] | INTRAMUSCULAR | Status: DC
  Administered 2021-10-04 – 2021-10-06 (×6): 1 [IU] via SUBCUTANEOUS

## 2021-10-04 MED ORDER — HEPARIN SODIUM (PORCINE) 5000 UNIT/ML IJ SOLN: 5000.0000 [IU] | Freq: Three times a day (TID) | INTRAMUSCULAR | Status: DC

## 2021-10-04 MED ORDER — HEPARIN (PORCINE) 25000 UT/250ML-% IV SOLN
850.0000 [IU]/h | INTRAVENOUS | Status: DC
  Administered 2021-10-04: 600 [IU]/h via INTRAVENOUS
  Administered 2021-10-05: 850 [IU]/h via INTRAVENOUS
  Filled 2021-10-04 (×2): qty 250

## 2021-10-04 MED ORDER — HEPARIN BOLUS VIA INFUSION
1500.0000 [IU] | Freq: Once | INTRAVENOUS | Status: AC
  Administered 2021-10-04: 1500 [IU] via INTRAVENOUS
  Filled 2021-10-04: qty 1500

## 2021-10-04 MED ORDER — NOREPINEPHRINE 4 MG/250ML-% IV SOLN
2.0000 ug/min | INTRAVENOUS | Status: DC
  Administered 2021-10-04: 6 ug/min via INTRAVENOUS
  Administered 2021-10-04: 10 ug/min via INTRAVENOUS
  Administered 2021-10-06: 2 ug/min via INTRAVENOUS
  Filled 2021-10-04 (×3): qty 250

## 2021-10-04 MED ORDER — SODIUM CHLORIDE 0.9 % IV SOLN
2.0000 g | Freq: Once | INTRAVENOUS | Status: AC
  Administered 2021-10-04: 2 g via INTRAVENOUS
  Filled 2021-10-04: qty 2

## 2021-10-04 MED ORDER — SODIUM CHLORIDE 0.9 % IV SOLN
2.0000 g | INTRAVENOUS | Status: DC
  Administered 2021-10-04 – 2021-10-05 (×2): 2 g via INTRAVENOUS
  Filled 2021-10-04 (×2): qty 2

## 2021-10-04 MED ORDER — CHLORHEXIDINE GLUCONATE CLOTH 2 % EX PADS
6.0000 | MEDICATED_PAD | Freq: Every day | CUTANEOUS | Status: DC
  Administered 2021-10-04 – 2021-10-06 (×4): 6 via TOPICAL

## 2021-10-04 MED ORDER — AMIODARONE IV BOLUS ONLY 150 MG/100ML
150.0000 mg | Freq: Once | INTRAVENOUS | Status: AC
  Administered 2021-10-04: 150 mg via INTRAVENOUS
  Filled 2021-10-04: qty 100

## 2021-10-04 MED ORDER — SODIUM CHLORIDE 0.9 % IV SOLN
250.0000 mL | INTRAVENOUS | Status: DC
  Administered 2021-10-04 – 2021-10-05 (×2): 250 mL via INTRAVENOUS

## 2021-10-04 MED ORDER — SODIUM CHLORIDE 0.9 % IV SOLN: INTRAVENOUS | Status: AC

## 2021-10-04 MED ORDER — LACTATED RINGERS IV BOLUS
1000.0000 mL | Freq: Once | INTRAVENOUS | Status: AC
  Administered 2021-10-04: 1000 mL via INTRAVENOUS

## 2021-10-04 MED ORDER — AMIODARONE LOAD VIA INFUSION
150.0000 mg | Freq: Once | INTRAVENOUS | Status: AC
  Administered 2021-10-04: 150 mg via INTRAVENOUS
  Filled 2021-10-04: qty 83.34

## 2021-10-04 MED ORDER — VANCOMYCIN HCL 1250 MG/250ML IV SOLN
1250.0000 mg | Freq: Once | INTRAVENOUS | Status: AC
  Administered 2021-10-04: 1250 mg via INTRAVENOUS
  Filled 2021-10-04 (×2): qty 250

## 2021-10-04 MED ORDER — LACTATED RINGERS IV BOLUS
500.0000 mL | Freq: Once | INTRAVENOUS | Status: AC
  Administered 2021-10-04: 500 mL via INTRAVENOUS

## 2021-10-04 MED ORDER — WHITE PETROLATUM EX OINT
TOPICAL_OINTMENT | CUTANEOUS | Status: AC
  Filled 2021-10-04: qty 28.35

## 2021-10-04 MED ORDER — ORAL CARE MOUTH RINSE
15.0000 mL | Freq: Two times a day (BID) | OROMUCOSAL | Status: DC
  Administered 2021-10-04 – 2021-10-06 (×7): 15 mL via OROMUCOSAL

## 2021-10-04 NOTE — Progress Notes (Signed)
ANTICOAGULATION CONSULT NOTE Pharmacy Consult for Heparin  Indication: Atrial fibrillation   Allergies  Allergen Reactions   Demerol [Meperidine] Nausea Only    Patient Measurements: Height: 4\' 9"  (144.8 cm) Weight: 45.2 kg (99 lb 10.4 oz) IBW/kg (Calculated) : 38.6 Vital Signs: Temp: 97.7 F (36.5 C) (11/06 1922) Temp Source: Oral (11/06 1922) BP: 101/75 (11/06 1830) Pulse Rate: 140 (11/06 1830)  Labs: Recent Labs    10/12/2021 1815 10/13/2021 1859 10/12/2021 2116 10/12/2021 2227 10/04/21 0258 10/04/21 1111 10/04/21 2153  HGB 10.5* 10.9*  --   --   --   --   --   HCT 30.2* 32.0*  --   --   --   --   --   PLT 329  --   --   --   --   --   --   APTT 39*  --   --   --   --   --   --   LABPROT 16.0*  --   --   --   --   --   --   INR 1.3*  --   --   --   --   --   --   HEPARINUNFRC  --   --   --   --   --  <0.10* <0.10*  CREATININE 2.19*  --   --  1.72*  --   --  1.31*  CKTOTAL 2,841*  --   --   --  2,189*  --   --   TROPONINIHS 1,088*  --  941*  --   --   --   --      Estimated Creatinine Clearance: 18.1 mL/min (A) (by C-G formula based on SCr of 1.31 mg/dL (H)).  Assessment: 87 y.o. female with Afib for heparin  Goal of Therapy:  Heparin level 0.3-0.7 units/ml Monitor platelets by anticoagulation protocol: Yes   Plan:  Increase Heparin 850 units/hr Follow-up am labs.  Phillis Knack, PharmD, BCPS

## 2021-10-04 NOTE — Progress Notes (Signed)
Brief CC Progress Note  Patient admitted overnight s/p fall and found down after family didn't hear from her for a couple days. Overnight still in AF with RVR,    Objective:  No increased work of breathing, lungs clear Irregularly irregular, HR in 110s Right shoulder joint is swollen and erythematous an painful to move Facial ecchymoses. Awake, alert, follows commands, not fully oriented   Labs: Cr 1.72 down from 2.19 Na 129 UA positive for Hgb - likely myoglobinuria   Assessment and Plan  LAURALEE WATERS is a 87 y.o. woman with:  Hypovolemic Shock Atrial Fibrillation with RVR Rhabdomyolysis Acute Kidney Injury, oliguric Hypovolemic hyponatremia   Plan is to continue vasopressor support. Will give additional fluid boluses due to hypovelmic state. LR and NS.  Continue amiodarone for now. Can consider BB addition once off pressors if needed. Hopefully won't need amiodarone long term and this is an acute stress response.  Follow UOP Will clear Cervical Collar - no acute fractures just chronic degenerative changes. Will get xray right shoulder  I discussed this plan with patient's son at bedside. He notes that they have been talking to her about going into assisted living and just last week she politely declined. He did state that she is a DNR.    I spent an additional 40 minutes in CC time for this patient.   The patient is critically ill due to hypovolemic shock, AF RVR.  Critical care was necessary to treat or prevent imminent or life-threatening deterioration.  Critical care was time spent personally by me on the following activities: development of treatment plan with patient and/or surrogate as well as nursing, discussions with consultants, evaluation of patient's response to treatment, examination of patient, obtaining history from patient or surrogate, ordering and performing treatments and interventions, ordering and review of laboratory studies, ordering and review of  radiographic studies, pulse oximetry, re-evaluation of patient's condition and participation in multidisciplinary rounds.   Critical Care Time devoted to patient care services described in this note is 40 minutes. This time reflects time of care of this Cash . This critical care time does not reflect separately billable procedures or procedure time, teaching time or supervisory time of PA/NP/Med student/Med Resident etc but could involve care discussion time.       Spero Geralds [facility] 10/04/2021 1:01 PM  Pager: see AMION  If no response to pager , please call critical care on call (see AMION) until 7pm After 7:00 pm call [place]

## 2021-10-04 NOTE — Progress Notes (Signed)
ANTICOAGULATION CONSULT NOTE - Initial Consult  Pharmacy Consult for Heparin  Indication: Atrial fibrillation   Allergies  Allergen Reactions   Demerol [Meperidine] Nausea Only    Patient Measurements: Height: 4\' 9"  (144.8 cm) Weight: 45.2 kg (99 lb 10.4 oz) IBW/kg (Calculated) : 38.6 Vital Signs: Temp: 97.6 F (36.4 C) (11/06 1120) Temp Source: Oral (11/06 1120) BP: 105/63 (11/06 1300) Pulse Rate: 115 (11/06 1300)  Labs: Recent Labs    10/27/2021 1815 10/08/2021 1859 10/04/2021 2116 10/10/2021 2227 10/04/21 1111  HGB 10.5* 10.9*  --   --   --   HCT 30.2* 32.0*  --   --   --   PLT 329  --   --   --   --   APTT 39*  --   --   --   --   LABPROT 16.0*  --   --   --   --   INR 1.3*  --   --   --   --   HEPARINUNFRC  --   --   --   --  <0.10*  CREATININE 2.19*  --   --  1.72*  --   CKTOTAL 2,841*  --   --   --   --   TROPONINIHS 1,088*  --  941*  --   --      Estimated Creatinine Clearance: 13.8 mL/min (A) (by C-G formula based on SCr of 1.72 mg/dL (H)).   Medical History: Past Medical History:  Diagnosis Date   Arthritis    gouty arthritis knees and hands   Diabetes mellitus without complication (HCC)    Hx of seasonal allergies    Hypertension     Assessment: 87 y/o F who was found down for unknown period of time at home, new onset afib, starting heparin, Hgb 10.9, plts good, noted renal dysfunction. PTA meds reviewed.  HL undetectable (on 600 units/hr) Some Hgb in the urine.    Goal of Therapy:  Heparin level 0.3-0.7 units/ml Monitor platelets by anticoagulation protocol: Yes   Plan:  Will increase to heparin 700 units/hr, cautiously due to patient weight and concern for some hematuria Heparin level @2200  Daily heparin level and CBC ordered.   Thank you for allowing pharmacy to be a part of this patient's care.  Donnald Garre, PharmD Clinical Pharmacist  Please check AMION for all [place] numbers After 10:00 PM, call [place]  [phone]

## 2021-10-04 NOTE — Progress Notes (Signed)
PT Cancellation Note  Patient Details Name: Kelly Webster MRN: [id number] DOB: 1931/04/16   Cancelled Treatment:    Reason Eval/Treat Not Completed: Patient not medically ready Discussed with RN; pt not quite ready for PT evaluation. Will check in tomorrow.  Wyona Almas, PT, DPT Acute Rehabilitation Services Pager [phone] Office [phone]    Deno Etienne 10/04/2021, 8:55 AM

## 2021-10-04 NOTE — Progress Notes (Addendum)
[facility] Progress Note Patient Name: Kelly Webster DOB: [birth year] MRN: [id number]   Date of Service  10/04/2021  HPI/Events of Note  AFIB with RVR - Ventricular rate up to 152 at times. Currently on Amiodarone IV infusion and Norepinephrine IV infusion at 4 mcg/min.   eICU Interventions  Plan: Wean Norepinephrine IV infusion as tolerated. Amiodarone 150 mg IV bolus over 10 minutes. BMP and Mg++ level STAT.     Intervention Category Major Interventions: Arrhythmia - evaluation and management  Andreus Cure [place] 10/04/2021, 8:51 PM

## 2021-10-04 NOTE — H&P (Addendum)
NAME:  Kelly Webster, MRN:  [id number], DOB:  April 29, 1931, LOS: 1 ADMISSION DATE:  09/30/2021, CONSULTATION DATE:  10/04/2021 REFERRING MD:  Dr. Cherly Hensen DO REASON FOR CONSULTATION: Shock  History of Present Illness:  87 y/o F with history of diabetes and hypertension who presented to the ED after being found down at home.   Last seen by family on Monday, 10/31. Family was unable to get ahold of her by phone through the week and found her lodged between her bed and wall with multiple pressure sores.  Patient is exceptionally hard of hearing and has difficulty providing additional history. States that she had a bad fall at home but cannot elaborate more than this. Family unable to be contacted by phone.  In the ED was noted to be hypotensive. Initially responded to IVF but eventually required initiation of norepinephrine. Broad spectrum abx started but no obvious source of infection noted.   Pertinent  Medical History  1) Diabetes 2) Hypertension 3) Gout 4) GERD 5) Cognitive dysfunction  Significant Hospital Events: Including procedures, antibiotic start and stop dates in addition to other pertinent events   Admitted to the ICU in setting of shock   Objective   Blood pressure (!) 80/54, pulse (!) 111, temperature (!) 96.5 F (35.8 C), temperature source Rectal, resp. rate 17, height 4\' 9"  (1.448 m), weight 60.3 kg, SpO2 96 %.        Intake/Output Summary (Last 24 hours) at 10/04/2021 0048 Last data filed at 10/06/2021 2320 Gross per 24 hour  Intake 1000 ml  Output --  Net 1000 ml   Filed Weights   10/02/2021 1954  Weight: 60.3 kg    Examination: General: cachectic, chronically ill appearing in no obvious distress HENT: anciteric sclera. Pupils equal in size and reactive. Dry oral mucosa. Poor dentition  Lungs: symmetric chest expansion. CTAB. Breathing nonlabored on room air Cardiovascular: irregular rhythm. Tachycardic. No murmur. Extremities warm. No pedal  edema Abdomen: nondistended, nontender Extremities: no cyanosis. No clubbing Neuro: awake. Hard of hearing. Oriented to self, location and year. Speech  soft but intelligible. Moves all extremities to command but does have more difficulty moving the RUE antigravity Integ: Deep ulcer right temple. Soft tissue injury right arm.   Assessment & Plan:   #Undifferentiated Shock --Patient presents with  shock refractory to initial fluid resuscitation in the Emergency Departmetn and End organ dysfunction as manifested by acute kidney injury now status post  3L isotonic fluid resuscitation and empiric vanc/cefepime  in the emergency department --Differential diagnosis:  Given exam and history, still feel is volume down and hypovolemia may continue to be playing a role here Given advanced age, hypothermia, and leukocytosis sepsis is possible although there is no obvious source at present RVR with rates 120-140, do not feel this represents unstable tachyarrythmia. Diltiazem started in ED may also have contributed   --Diagnostic plan: Additional IVF volume challenge, Blood cultures, Urinalysis/Culture, and MRSA Nares Swab --Therapeutic plan: Norepinephrine gtt  to maintain a MAP > 65 Cefepime and Vancomycin for now Adjunctive therapies: additional bolus of 1 LR Stop diltiazem  #Atrial fibrillation with RVR --no documented prior history. Rates currently 120-140. Will continue to correct underlying volume and metabolic abnormalities. Obtain TTE once rates more reasonable. Optimize electrolytes. Heparin gtt for CVA prophylaxis.  #Rhabdomyolysis --continuing IVF resuscitation, monitor UOP and renal function. Pulse checks on right upper and lower extremity q4hrs x 3. No evidence of compartment syndrome at this time.  #Acute Kidney Injury --Presented  on11/13/2022 with creatinine of 2.19, elevated from baseline creatinine of 0.9 --Patient appears hypovolemic on exam --Differential diagnosis: ,  rhabdomyolysis Decreased effective circulating volume due to hypovolemia and with possible ATN --Diagnostic plan: Monitor renal function as above therapies are instituted     #Trauma --No witness. Patient reportedly living alone with documentation in the chart of some element of congitive dysfunction. Home med list includes benzodiazepine, tramadol, and seroquel - concerning for polypharmacy in this 87 y/o F which will need to be addressed before discharge. CT with infarcts favored to be remote in time. Will obtain XR of forearm to complete trauma survey. Consult PT, will likely need placement  #Pressure wounds, POA --wound care consulted  #Diabetes mellitus --SSI ordered. Goal BG < 180 on average   Best Practice (right click and "Reselect all SmartList Selections" daily)   Diet/type: NPO DVT prophylaxis: systemic heparin GI prophylaxis: N/A Lines: N/A Foley:  Yes, and it is still needed,  Code Status:  full code Last date of multidisciplinary goals of care discussion [NA]  Labs   CBC: Recent Labs  Lab 10/23/2021 1815 10/13/2021 1859  WBC 21.9*  --   NEUTROABS 19.8*  --   HGB 10.5* 10.9*  HCT 30.2* 32.0*  MCV 91.5  --   PLT 329  --     Basic Metabolic Panel: Recent Labs  Lab 10/23/2021 1815 10/09/2021 1859  NA 131* 129*  K 3.8 3.8  CL 95*  --   CO2 22  --   GLUCOSE 146*  --   BUN 99*  --   CREATININE 2.19*  --   CALCIUM 10.5*  --   MG 3.1*  --    GFR: Estimated Creatinine Clearance: 13.3 mL/min (A) (by C-G formula based on SCr of 2.19 mg/dL (H)). Recent Labs  Lab 10/11/2021 1815 10/19/2021 2117  WBC 21.9*  --   LATICACIDVEN  --  1.7    Liver Function Tests: Recent Labs  Lab 09/29/2021 1815  AST 116*  ALT 73*  ALKPHOS 66  BILITOT 0.7  PROT 6.0*  ALBUMIN 2.7*   No results for input(s): LIPASE, AMYLASE in the last 168 hours. No results for input(s): AMMONIA in the last 168 hours.  ABG    Component Value Date/Time   HCO3 23.6 10/02/2021 1859   TCO2 25  10/10/2021 1859   ACIDBASEDEF 2.0 10/13/2021 1859   O2SAT 84.0 10/05/2021 1859     Coagulation Profile: Recent Labs  Lab 10/20/2021 1815  INR 1.3*    Cardiac Enzymes: Recent Labs  Lab 10/06/2021 1815  CKTOTAL 2,841*    HbA1C: Hgb A1c MFr Bld  Date/Time Value Ref Range Status  07/16/2015 05:25 AM 6.1 (H) 4.8 - 5.6 % Final    Comment:    (NOTE)         Pre-diabetes: 5.7 - 6.4         Diabetes: >6.4         Glycemic control for adults with diabetes: <7.0     CBG: Recent Labs  Lab 10/15/2021 1839  GLUCAP 164*    Imaging:  1) CXR (10/20/2021) - no abnormality 2) XR Humerus (10/17/2021) - no fracture 3) XR Pelvis (10/24/2021) - no fracture 4) XR Tib/Fib (10/19/2021) - no fracture 5) CTH (10/15/2021) - small right frontal infarct which is age indeterminate but favored to be nonacute. A remote left caudate infarct is noted as well. No other acute abnormalities. 6) C spine CT (10/18/2021) no fracture or traumatic malalignment.  Review of Systems:   Negative  Past Medical History:  She,  has a past medical history of Arthritis, Diabetes mellitus without complication ([place]), seasonal allergies, and Hypertension.   Surgical History:   Past Surgical History:  Procedure Laterality Date   ABDOMINAL HYSTERECTOMY     1981   APPENDECTOMY     1967   BREAST BIOPSY     BREAST LUMPECTOMY     right 2014   BREAST LUMPECTOMY WITH NEEDLE LOCALIZATION Right 07/27/2013   Procedure: BREAST LUMPECTOMY WITH NEEDLE LOCALIZATION;  Surgeon: Edward Jolly, MD;  Location: [facility];  Service: General;  Laterality: Right;   [place]     Social History:   reports that she quit smoking about 41 years ago. Her smoking use included cigarettes. She smoked an average of .25 packs per day. She does not have any smokeless tobacco history on file. She reports that she does not drink alcohol and does not use drugs.   Family  History:  Her family history includes Bone cancer in her cousin; Colon cancer in her maternal uncle and sister; Lung cancer in her maternal uncle.   Allergies Allergies  Allergen Reactions   Demerol [Meperidine] Nausea Only     Home Medications  Prior to Admission medications   Medication Sig Start Date End Date Taking? Authorizing Provider  allopurinol (ZYLOPRIM) 300 MG tablet Take 300 mg by mouth daily.   Yes [provider]  ALPRAZolam (NIRAVAM) 0.25 MG dissolvable tablet Take 0.25 mg by mouth at bedtime as needed for anxiety.   Yes [provider]  Ascorbic Acid (VITAMIN C) 1000 MG tablet Take 1,000 mg by mouth 2 (two) times daily.    Yes [provider]  colchicine 0.6 MG tablet Take 0.6 mg by mouth daily.   Yes [provider]  ibuprofen (ADVIL,MOTRIN) 200 MG tablet Take 200 mg by mouth every 6 (six) hours as needed for pain.   Yes [provider]  loratadine (CLARITIN) 10 MG tablet Take 10 mg by mouth daily.   Yes [provider]  losartan-hydrochlorothiazide (HYZAAR) 50-12.5 MG per tablet Take 0.5 tablets by mouth daily.   Yes [provider]  metFORMIN (GLUCOPHAGE) 500 MG tablet Take 500 mg by mouth daily.    Yes [provider]  Multiple Vitamin (MULTI-VITAMIN PO) Take 1 tablet by mouth daily.   Yes [provider]  omeprazole (PRILOSEC) 20 MG capsule Take 20 mg by mouth daily.   Yes [provider]  QUEtiapine (SEROQUEL) 25 MG tablet Take 1 tablet (25 mg total) by mouth at bedtime. Patient taking differently: Take 12.5 mg by mouth at bedtime. 07/21/15  Yes Rai, Ripudeep K, MD  Vitamin E 400 UNITS TABS Take 400 Units by mouth daily.    Yes [provider]  folic acid (FOLVITE) 1 MG tablet Take 1 tablet (1 mg total) by mouth daily. Patient not taking: Reported on 10/23/2021 07/21/15   Rai, Vernelle Emerald, MD  traMADol (ULTRAM) 50 MG tablet Take 1 tablet (50 mg total) by mouth every 8  (eight) hours as needed for severe pain. Patient not taking: Reported on 10/08/2021 07/23/15   Mendel Corning, MD     Critical care time: 42 minutes

## 2021-10-04 NOTE — Progress Notes (Signed)
IV consult received to place US guided IV for vasopressors. Pt assessed by 2 IV RNs with [place]. No suitable veins noted. Bedside RN notified that pt will need central line placed for vasopressors.

## 2021-10-04 NOTE — Progress Notes (Signed)
Pharmacy Antibiotic Note  Kelly Webster is a 87 y.o. female admitted on 10/12/2021 with sepsis.  Pharmacy has been consulted for Vancomycin/Cefepime dosing. WBC elevated. Noted renal dysfunction. Unclear source of infection. Does have multiple pressure wounds/sores from being found down for likely prolonged amount of time.   Plan: Vancomycin 750 mg IV q48h >>Estimated AUC: 548 Cefepime 2g IV q24h Trend WBC, temp, renal function  F/U infectious work-up Drug levels as indicated   Height: 4\' 9"  (144.8 cm) Weight: 60.3 kg (133 lb) IBW/kg (Calculated) : 38.6  Temp (24hrs), Avg:96.4 F (35.8 C), Min:96.3 F (35.7 C), Max:96.5 F (35.8 C)  Recent Labs  Lab 10/04/2021 1815 10/17/2021 2117 10/26/2021 2227  WBC 21.9*  --   --   CREATININE 2.19*  --  1.72*  LATICACIDVEN  --  1.7  --     Estimated Creatinine Clearance: 16.9 mL/min (A) (by C-G formula based on SCr of 1.72 mg/dL (H)).    Allergies  Allergen Reactions   Demerol [Meperidine] Nausea Only   Narda Bonds, PharmD, BCPS Clinical Pharmacist Phone: [phone]

## 2021-10-04 NOTE — Progress Notes (Signed)
ANTICOAGULATION CONSULT NOTE - Initial Consult  Pharmacy Consult for Heparin  Indication: Atrial fibrillation   Allergies  Allergen Reactions   Demerol [Meperidine] Nausea Only    Patient Measurements: Height: 4\' 9"  (144.8 cm) Weight: 60.3 kg (133 lb) IBW/kg (Calculated) : 38.6 Vital Signs: Temp: 96.3 F (35.7 C) (11/06 0129) Temp Source: Axillary (11/06 0129) BP: 111/55 (11/06 0129) Pulse Rate: 136 (11/06 0129)  Labs: Recent Labs    10/08/2021 1815 10/25/2021 1859 10/27/2021 2116 10/02/2021 2227  HGB 10.5* 10.9*  --   --   HCT 30.2* 32.0*  --   --   PLT 329  --   --   --   APTT 39*  --   --   --   LABPROT 16.0*  --   --   --   INR 1.3*  --   --   --   CREATININE 2.19*  --   --  1.72*  CKTOTAL 2,841*  --   --   --   TROPONINIHS 1,088*  --  941*  --     Estimated Creatinine Clearance: 16.9 mL/min (A) (by C-G formula based on SCr of 1.72 mg/dL (H)).   Medical History: Past Medical History:  Diagnosis Date   Arthritis    gouty arthritis knees and hands   Diabetes mellitus without complication (HCC)    Hx of seasonal allergies    Hypertension     Assessment: 87 y/o F who was found down for unknown period of time at home, new onset afib, starting heparin, Hgb 10.9, plts good, noted renal dysfunction. PTA meds reviewed.   Goal of Therapy:  Heparin level 0.3-0.7 units/ml Monitor platelets by anticoagulation protocol: Yes   Plan:  Heparin 1500 units BOLUS Start heparin drip at 600 units/hr 1100 Heparin level Daily CBC/Heparin level Monitor for bleeding  Narda Bonds, PharmD, BCPS Clinical Pharmacist Phone: [phone]

## 2021-10-04 NOTE — Progress Notes (Signed)
[facility] Progress Note Patient Name: Kelly Webster DOB: August 08, 1931 MRN: [id number]   Date of Service  10/04/2021  HPI/Events of Note  87 y/o F with history of diabetes and hypertension who presented to the ED after being found down at home. Last seen by family on Monday, 10/31. Family was unable to get ahold of her by phone through the week and found her lodged between her bed and wall with multiple pressure sores. Arrives in ICU on a Norepinephrine IV infusion and in AFIB with RVR. Ventricular rate = 125-140. Last K+ = 3.8.  eICU Interventions  Plan: D/C Cardizem 10 mg IV. Amiodarone IV load and infusion. Replace K+. Mg++ level STAT.      Intervention Category Evaluation Type: New Patient Evaluation  Lysle Dingwall 10/04/2021, 1:49 AM

## 2021-10-05 ENCOUNTER — Inpatient Hospital Stay (HOSPITAL_COMMUNITY): Payer: PPO

## 2021-10-05 DIAGNOSIS — J690 Pneumonitis due to inhalation of food and vomit: Secondary | ICD-10-CM | POA: Diagnosis not present

## 2021-10-05 DIAGNOSIS — I4891 Unspecified atrial fibrillation: Secondary | ICD-10-CM | POA: Diagnosis not present

## 2021-10-05 DIAGNOSIS — Z515 Encounter for palliative care: Secondary | ICD-10-CM | POA: Diagnosis not present

## 2021-10-05 DIAGNOSIS — E871 Hypo-osmolality and hyponatremia: Secondary | ICD-10-CM | POA: Diagnosis not present

## 2021-10-05 DIAGNOSIS — Z23 Encounter for immunization: Secondary | ICD-10-CM | POA: Diagnosis not present

## 2021-10-05 DIAGNOSIS — J9601 Acute respiratory failure with hypoxia: Secondary | ICD-10-CM | POA: Diagnosis not present

## 2021-10-05 DIAGNOSIS — K920 Hematemesis: Secondary | ICD-10-CM | POA: Diagnosis not present

## 2021-10-05 DIAGNOSIS — E43 Unspecified severe protein-calorie malnutrition: Secondary | ICD-10-CM | POA: Diagnosis not present

## 2021-10-05 DIAGNOSIS — R579 Shock, unspecified: Secondary | ICD-10-CM | POA: Diagnosis not present

## 2021-10-05 DIAGNOSIS — Z66 Do not resuscitate: Secondary | ICD-10-CM | POA: Diagnosis not present

## 2021-10-05 DIAGNOSIS — F05 Delirium due to known physiological condition: Secondary | ICD-10-CM | POA: Diagnosis not present

## 2021-10-05 DIAGNOSIS — R34 Anuria and oliguria: Secondary | ICD-10-CM | POA: Diagnosis not present

## 2021-10-05 DIAGNOSIS — R571 Hypovolemic shock: Secondary | ICD-10-CM | POA: Diagnosis not present

## 2021-10-05 DIAGNOSIS — R64 Cachexia: Secondary | ICD-10-CM | POA: Diagnosis not present

## 2021-10-05 LAB — CBC
HCT: 23.8 % — ABNORMAL LOW (ref 36.0–46.0)
Hemoglobin: 8.6 g/dL — ABNORMAL LOW (ref 12.0–15.0)
MCH: 32.5 pg (ref 26.0–34.0)
MCHC: 36.1 g/dL — ABNORMAL HIGH (ref 30.0–36.0)
MCV: 89.8 fL (ref 80.0–100.0)
Platelets: 252 10*3/uL (ref 150–400)
RBC: 2.65 MIL/uL — ABNORMAL LOW (ref 3.87–5.11)
RDW: 13.3 % (ref 11.5–15.5)
WBC: 16 10*3/uL — ABNORMAL HIGH (ref 4.0–10.5)
nRBC: 0 % (ref 0.0–0.2)

## 2021-10-05 LAB — GLUCOSE, CAPILLARY
Glucose-Capillary: 112 mg/dL — ABNORMAL HIGH (ref 70–99)
Glucose-Capillary: 118 mg/dL — ABNORMAL HIGH (ref 70–99)
Glucose-Capillary: 112 mg/dL — ABNORMAL HIGH (ref 70–99)
Glucose-Capillary: 120 mg/dL — ABNORMAL HIGH (ref 70–99)
Glucose-Capillary: 116 mg/dL — ABNORMAL HIGH (ref 70–99)

## 2021-10-05 LAB — ECHOCARDIOGRAM COMPLETE
AR max vel: 1.79 cm2
AV Area VTI: 1.92 cm2
AV Area mean vel: 1.57 cm2
AV Mean grad: 10 mmHg
AV Peak grad: 13.3 mmHg
Ao pk vel: 1.83 m/s
Area-P 1/2: 3.19 cm2
Height: 57 in
P 1/2 time: 209 ms
S' Lateral: 2.12 cm
Weight: 1671.97 [oz_av]

## 2021-10-05 LAB — HEPARIN LEVEL (UNFRACTIONATED)
Heparin Unfractionated: 0.33 [IU]/mL (ref 0.30–0.70)
Heparin Unfractionated: 0.4 IU/mL (ref 0.30–0.70)

## 2021-10-05 LAB — BASIC METABOLIC PANEL
Anion gap: 7 (ref 5–15)
BUN: 75 mg/dL — ABNORMAL HIGH (ref 8–23)
CO2: 23 mmol/L (ref 22–32)
Calcium: 9.9 mg/dL (ref 8.9–10.3)
Chloride: 102 mmol/L (ref 98–111)
Creatinine, Ser: 1.24 mg/dL — ABNORMAL HIGH (ref 0.44–1.00)
GFR, Estimated: 42 mL/min — ABNORMAL LOW (ref >60)
Glucose, Bld: 124 mg/dL — ABNORMAL HIGH (ref 70–99)
Potassium: 3.4 mmol/L — ABNORMAL LOW (ref 3.5–5.1)
Sodium: 132 mmol/L — ABNORMAL LOW (ref 135–145)

## 2021-10-05 LAB — CK: Total CK: 715 U/L — ABNORMAL HIGH (ref 38–234)

## 2021-10-05 MED ORDER — AMIODARONE HCL 200 MG PO TABS
400.0000 mg | ORAL_TABLET | Freq: Two times a day (BID) | ORAL | Status: DC
  Administered 2021-10-05 – 2021-10-06 (×3): 400 mg via ORAL
  Filled 2021-10-05 (×3): qty 2

## 2021-10-05 MED ORDER — GERHARDT'S BUTT CREAM
TOPICAL_CREAM | Freq: Two times a day (BID) | CUTANEOUS | Status: DC
  Filled 2021-10-05: qty 1

## 2021-10-05 MED ORDER — MUPIROCIN 2 % EX OINT
TOPICAL_OINTMENT | Freq: Every day | CUTANEOUS | Status: DC
  Filled 2021-10-05 (×2): qty 22

## 2021-10-05 MED ORDER — POTASSIUM CHLORIDE 10 MEQ/100ML IV SOLN
10.0000 meq | INTRAVENOUS | Status: AC
  Administered 2021-10-05 (×4): 10 meq via INTRAVENOUS
  Filled 2021-10-05 (×4): qty 100

## 2021-10-05 MED ORDER — POTASSIUM CHLORIDE CRYS ER 20 MEQ PO TBCR
20.0000 meq | EXTENDED_RELEASE_TABLET | Freq: Once | ORAL | Status: AC
  Administered 2021-10-05: 20 meq via ORAL
  Filled 2021-10-05: qty 1

## 2021-10-05 NOTE — Progress Notes (Signed)
NAME:  Kelly Webster, MRN:  [id number], DOB:  1931/04/28, LOS: 2 ADMISSION DATE:  10/27/2021, CONSULTATION DATE:  10/04/2021 REFERRING MD:  EDP, CHIEF COMPLAINT:  Shock   History of Present Illness:  [name] is an 87 year old female with PMHx of hypertension and diabetes who presented to the ED after being found down at home. Last seen by family on 10/31, family found her lodged between her bed and wall with multiple pressure sores on 11/5 and brought her in to the ED.  Patient is hard of hearing and has difficulty providing addisional history. She states that she had a bad fall at home but unable to further elaborate.  In the ED, patient noted to be hypotensive. Initially responded to IVF but eventually required initiation of norepinephrine. Broad spectrum antibiotics started but no obvious source of infection ntoed.   Pertinent  Medical History   Past Medical History:  Diagnosis Date   Arthritis    gouty arthritis knees and hands   Diabetes mellitus without complication ([place])    Hx of seasonal allergies    Hypertension    Significant Hospital Events: Including procedures, antibiotic start and stop dates in addition to other pertinent events   11/6> admitted to ICU in setting of shock  Interim History / Subjective:  Overnight, no acute events. Patient is sitting up in bedside chair. No acute distress. Continued on pressor support.   Objective   Blood pressure (!) 116/56, pulse 93, temperature 98.3 F (36.8 C), temperature source Axillary, resp. rate 11, height 4\' 9"  (1.448 m), weight 47.4 kg, SpO2 96 %.        Intake/Output Summary (Last 24 hours) at 10/05/2021 0801 Last data filed at 10/05/2021 0600 Gross per 24 hour  Intake 3003.74 ml  Output 1115 ml  Net 1888.74 ml   Filed Weights   10/09/2021 1954 10/04/21 0351 10/05/21 0515  Weight: 60.3 kg 45.2 kg 47.4 kg    Examination: General: chronically ill appearing female; not in acute distress HENT: Normocephalic;  2cm lesion above the right eyebrow with dried blood; EOMI, PERRL, dry mucus membranes Lungs: CTAB, on room air Cardiovascular: Irregularly irregular, tachycardic, S1 and S2 present Abdomen: soft, nondistended, nontender, +BS Extremities: bilat upper extremities with nonpitting edema; warm and dry; diffuse muscle rigidity Neuro: alert and oriented x3, mildly encephalopathic but unclear if this is baseline   Resolved Hospital Problem list    Assessment & Plan:  Hypovolemic shock - improving Patient presented with hypovolemic shock in setting of being found down for >48 hours. She has received 5+L IVF since admission. Initially required vasopressors to maintain MAP>65. Titrating down. Mucus membranes continue to appear dry.  - Wean off levophed as able  - Encourage PO intake - Blood cultures neg to date - discontinue vanc  Atrial fibrillation with RVR No hx of atrial fibrillation. This is likely in setting of shock as above with levophed. Currently rate controlled. CHADs2-VASc score 5; HASBLED score 2 with history of frequent falls; leading to increased risk of bleeding. If atrial fibrillation persists, patient likely poor candidate for long term anticoagulation unless goes to SNF.  - repeat EKG - transition to PO amiodarone - Continue IV heparin gtt  Acute renal failure, oliguric Hypokalemia Rhabdomyolysis  Serum creatinine and CK improving with fluid resuscitation. Patient still remains oliguric but improving. - Continue to monitor renal function - Encourage oral hydration - Strict I&O - Monitor and replete electrolytes  - Avoid nephrotoxic agents   Hypovolemic  hyponatremia  2/2 severe dehydration as above. Na stable at 132 this AM - Continue to trend  Normocytic anemia Likely dilutional in setting of IV hydration. No signs of acute bleed at this time - Continue to trend CBC  Best Practice (right click and "Reselect all SmartList Selections" daily)   Diet/type: dysphagia diet  (see orders) DVT prophylaxis: systemic heparin GI prophylaxis: N/A Lines: N/A Foley:  N/A Code Status:  DNR Last date of multidisciplinary goals of care discussion [family updated at bedside 11/6]  Labs   CBC: Recent Labs  Lab 10/23/2021 1815 10/08/2021 1859 10/05/21 0307  WBC 21.9*  --  16.0*  NEUTROABS 19.8*  --   --   HGB 10.5* 10.9* 8.6*  HCT 30.2* 32.0* 23.8*  MCV 91.5  --  89.8  PLT 329  --  829    Basic Metabolic Panel: Recent Labs  Lab 10/26/2021 1815 10/09/2021 1859 10/14/2021 2227 10/04/21 0214 10/04/21 2153 10/05/21 0307  NA 131* 129* 129*  --  132* 132*  K 3.8 3.8 3.8  --  3.3* 3.4*  CL 95*  --  100  --  99 102  CO2 22  --  20*  --  24 23  GLUCOSE 146*  --  119*  --  149* 124*  BUN 99*  --  91*  --  74* 75*  CREATININE 2.19*  --  1.72*  --  1.31* 1.24*  CALCIUM 10.5*  --  9.0  --  10.0 9.9  MG 3.1*  --   --  2.5* 2.3  --   PHOS  --   --   --  3.2  --   --    GFR: Estimated Creatinine Clearance: 20.8 mL/min (A) (by C-G formula based on SCr of 1.24 mg/dL (H)). Recent Labs  Lab 10/21/2021 1815 10/01/2021 2117 10/04/21 0214 10/05/21 0307  WBC 21.9*  --   --  16.0*  LATICACIDVEN  --  1.7 1.0  --     Liver Function Tests: Recent Labs  Lab 10/11/2021 1815  AST 116*  ALT 73*  ALKPHOS 66  BILITOT 0.7  PROT 6.0*  ALBUMIN 2.7*   No results for input(s): LIPASE, AMYLASE in the last 168 hours. No results for input(s): AMMONIA in the last 168 hours.  ABG    Component Value Date/Time   HCO3 23.6 10/25/2021 1859   TCO2 25 10/08/2021 1859   ACIDBASEDEF 2.0 10/19/2021 1859   O2SAT 84.0 10/10/2021 1859     Coagulation Profile: Recent Labs  Lab 10/05/2021 1815  INR 1.3*    Cardiac Enzymes: Recent Labs  Lab 10/02/2021 1815 10/04/21 0258 10/05/21 0307  CKTOTAL 2,841* 2,189* 715*    HbA1C: Hgb A1c MFr Bld  Date/Time Value Ref Range Status  10/04/2021 03:27 AM 5.7 (H) 4.8 - 5.6 % Final    Comment:    (NOTE) Pre diabetes:           5.7%-6.4%  Diabetes:              >6.4%  Glycemic control for   <7.0% adults with diabetes   07/16/2015 05:25 AM 6.1 (H) 4.8 - 5.6 % Final    Comment:    (NOTE)         Pre-diabetes: 5.7 - 6.4         Diabetes: >6.4         Glycemic control for adults with diabetes: <7.0     CBG: Recent Labs  Lab 10/04/21 1521  10/04/21 1920 10/04/21 2318 10/05/21 0337 10/05/21 0719  GLUCAP 116* 136* 128* 112* 118*    Critical care time: 35 mins    Harvie Heck, MD Internal Medicine, PGY-3 10/05/21 12:36 PM Pager # [phone]

## 2021-10-05 NOTE — Evaluation (Addendum)
Physical Therapy Evaluation Patient Details Name: Kelly Webster MRN: [id number] DOB: 04-Oct-1931 Today's Date: 10/05/2021  History of Present Illness  87 yo admitted 11/5 after found down at home wedged between bed and wall with pressure sores. Last seen by family 10/31. Pt with septic shock and Afib. PMhx: HTN, DM, HOH, arthritis  Clinical Impression  Pt with flat affect, slow to respond and generally weak but once sitting EOB requesting OOB to chair despite pt aware that she was unable to step. Pt very rigid particularly with bil UE movement throughout session without significant AROM noted bil UE. Pt with ability to tolerate weight in standing but could sit or stand without max-total +2 assist. 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 Pt will benefit from acute therapy to maximize mobility, safety and function to decrease burden of care.        Recommendations for follow up therapy are one component of a multi-disciplinary discharge planning process, led by the attending physician.  Recommendations may be updated based on patient status, additional functional criteria and insurance authorization.  Follow Up Recommendations Skilled nursing-short term rehab (<3 hours/day)    Assistance Recommended at Discharge Frequent or constant Supervision/Assistance  Functional Status Assessment Patient has had a recent decline in their functional status and/or demonstrates limited ability to make significant improvements in function in a reasonable and predictable amount of time  Equipment Recommendations  Hospital bed;Wheelchair (measurements PT);BSC/3in1;Rolling walker (2 wheels)    Recommendations for Other Services OT consult     Precautions / Restrictions Precautions Precautions: Fall Precaution Comments: laceration right eyebrow, wound at right shoulder, pelvis and toes Restrictions Weight Bearing Restrictions: No      Mobility  Bed Mobility Overal bed mobility: Needs  Assistance Bed Mobility: Rolling;Sidelying to Sit Rolling: Total assist Sidelying to sit: Total assist;+2 for physical assistance       General bed mobility comments: total assist to roll and +2 assist to achieve sitting. Posterior right lean in sitting    Transfers Overall transfer level: Needs assistance   Transfers: Sit to/from Stand;Bed to chair/wheelchair/BSC Sit to Stand: Max assist;+2 physical assistance     Squat pivot transfers: Total assist;+2 physical assistance     General transfer comment: bil knees blocked with use of pad to cradle sacrum and allow rise to weight bearing without knee buckling. Anterior flexed trunk in standing with no activation of bil UE movement to assist with standing. Stood x 2 with total assist to pivot to chair    Ambulation/Gait               General Gait Details: unable  Stairs            Wheelchair Mobility    Modified Rankin (Stroke Patients Only)       Balance Overall balance assessment: Needs assistance Sitting-balance support: No upper extremity supported;Feet supported Sitting balance-Leahy Scale: Poor Sitting balance - Comments: posterior lean with min assist for sitting balance Postural control: Posterior lean Standing balance support: No upper extremity supported Standing balance-Leahy Scale: Zero Standing balance comment: bil knees blocked, anterior lean and total assist for standing balance                             Pertinent Vitals/Pain Facial Expression: Relaxed, neutral Body Movements: Absence of movements Muscle Tension: Tense, rigid Compliance with ventilator (intubated pts.): N/A Vocalization (extubated pts.): N/A CPOT Total: 1    Home Living Family/patient  expects to be discharged to:: Private residence Living Arrangements: Alone Available Help at Discharge: Family;Available PRN/intermittently Type of Home: House         Home Layout: One level Home Equipment: Cane -  single point      Prior Function Prior Level of Function : Independent/Modified Independent                     Hand Dominance        Extremity/Trunk Assessment   Upper Extremity Assessment Upper Extremity Assessment: RUE deficits/detail;LUE deficits/detail RUE Deficits / Details: anterior shoulder bruise/wound with edema RUE. No AROM noted in RUE LUE Deficits / Details: very weak <2/5 with functional mobility and limited ROM    Lower Extremity Assessment Lower Extremity Assessment: LLE deficits/detail;RLE deficits/detail RLE Deficits / Details: knee extension and flexion 2/5 LLE Deficits / Details: required assisted knee flexion in supine 1/5 and unable to engage quad to kick out leg in sitting. Pt not buckling with weight in standing    Cervical / Trunk Assessment Cervical / Trunk Assessment: Kyphotic  Communication   Communication: HOH  Cognition Arousal/Alertness: Awake/alert Behavior During Therapy: Flat affect Overall Cognitive Status: Impaired/Different from baseline Area of Impairment: Orientation;Attention;Memory;Safety/judgement;Following commands;Problem solving                 Orientation Level: Disoriented to;Time;Situation Current Attention Level: Focused Memory: Decreased short-term memory;Decreased recall of precautions Following Commands: Follows one step commands inconsistently;Follows one step commands with increased time Safety/Judgement: Decreased awareness of safety;Decreased awareness of deficits   Problem Solving: Slow processing General Comments: pt slow to respond to questions and commands with pt very rigid and weak. Pt able to state preferred name and that she fell but lack of response to motor commands        General Comments      Exercises     Assessment/Plan    PT Assessment Patient needs continued PT services  PT Problem List Decreased strength;Decreased mobility;Decreased safety awareness;Decreased range of  motion;Decreased activity tolerance;Decreased cognition;Decreased balance;Decreased knowledge of use of DME;Decreased coordination;Decreased skin integrity       PT Treatment Interventions DME instruction;Therapeutic exercise;Gait training;Balance training;Functional mobility training;Cognitive remediation;Neuromuscular re-education;Therapeutic activities;Patient/family education    PT Goals (Current goals can be found in the Care Plan section)  Acute Rehab PT Goals Patient Stated Goal: return home after I get stronger PT Goal Formulation: With patient Time For Goal Achievement: 10/19/21 Potential to Achieve Goals: Fair    Frequency Min 3X/week   Barriers to discharge Decreased caregiver support      Co-evaluation               AM-PAC PT "6 Clicks" Mobility  Outcome Measure Help needed turning from your back to your side while in a flat bed without using bedrails?: A Lot Help needed moving from lying on your back to sitting on the side of a flat bed without using bedrails?: Total Help needed moving to and from a bed to a chair (including a wheelchair)?: Total Help needed standing up from a chair using your arms (e.g., wheelchair or bedside chair)?: Total Help needed to walk in hospital room?: Total Help needed climbing 3-5 steps with a railing? : Total 6 Click Score: 7    End of Session   Activity Tolerance: Patient tolerated treatment well Patient left: in chair;with call bell/phone within reach;with chair alarm set Nurse Communication: Mobility status PT Visit Diagnosis: Other abnormalities of gait and mobility (R26.89);Difficulty in walking, not elsewhere  classified (R26.2);Muscle weakness (generalized) (M62.81);History of falling (Z91.81);Other symptoms and signs involving the nervous system (R29.898);Unsteadiness on feet (R26.81)    Time: 7902-4097 PT Time Calculation (min) (ACUTE ONLY): 20 min   Charges:   PT Evaluation $PT Eval Moderate Complexity: 1 Mod           [place], PT Acute Rehabilitation Services Pager: [phone] Office: [phone]   Niamya Vittitow B Jeanette Moffatt 10/05/2021, 11:03 AM

## 2021-10-05 NOTE — Consult Note (Signed)
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  Reason for Consult:Trauma wounds to right orbital bone, bilateral knees, right dorsal toes and right anterior shoulder. Wound type:trauma from fall  deep tissue injury to sacrum and bilateral upper buttocks.  Nonintact wound to coccyx.  Pressure Injury POA: Yes Measurement: coccyx:  0.3 cm scabbed lesion Sacrum/upper buttocks4 cm x 3 cm nonblanchable erythema Right shoulder:  6 cm x 3.4 cm maroon intact discoloration Mons pubis is bruised and firm:  4 cm x 7.4 cm intact Right first third and fourth toes with blood filled blisters Wound bed: scabbed above right eye  Drainage (amount, consistency, odor) dry  none Periwound:bruising to arms and legs Dressing procedure/placement/frequency: Cleanse wound above right eye with Ns and pat dry. Apply mupirocin ointment and cover with dry bandage daily.  Silicone foam dressing to bilateral knees and right toes, right shoulder Gerhardts butt paste to sacrum/coccyx each shift  turn and reposition every two hours.  Will not follow at this time.  Please re-consult if needed.  Domenic Moras MSN, RN, FNP-BC CWON Wound, Ostomy, Continence Nurse Pager [phone]

## 2021-10-05 NOTE — Progress Notes (Signed)
  Echocardiogram 2D Echocardiogram has been performed.  Kelly Webster 10/05/2021, 12:24 PM

## 2021-10-05 NOTE — Progress Notes (Signed)
ANTICOAGULATION CONSULT NOTE - Follow Up Consult  Pharmacy Consult for IV Heparin Indication: atrial fibrillation  Allergies  Allergen Reactions   Demerol [Meperidine] Nausea Only    Patient Measurements: Height: 4\' 9"  (144.8 cm) Weight: 47.4 kg (104 lb 8 oz) IBW/kg (Calculated) : 38.6 Heparin Dosing Weight: 47.4 kg  Vital Signs: Temp: 98.3 F (36.8 C) (11/07 1520) Temp Source: Axillary (11/07 1520) BP: 106/72 (11/07 1700) Pulse Rate: 113 (11/07 1700)  Labs: Recent Labs    10/01/2021 1815 10/16/2021 1859 10/10/2021 2116 10/18/2021 2227 10/04/21 0258 10/04/21 1111 10/04/21 2153 10/05/21 0307 10/05/21 0913 10/05/21 1636  HGB 10.5* 10.9*  --   --   --   --   --  8.6*  --   --   HCT 30.2* 32.0*  --   --   --   --   --  23.8*  --   --   PLT 329  --   --   --   --   --   --  252  --   --   APTT 39*  --   --   --   --   --   --   --   --   --   LABPROT 16.0*  --   --   --   --   --   --   --   --   --   INR 1.3*  --   --   --   --   --   --   --   --   --   HEPARINUNFRC  --   --   --   --   --    < > <0.10*  --  0.33 0.40  CREATININE 2.19*  --   --  1.72*  --   --  1.31* 1.24*  --   --   CKTOTAL 2,841*  --   --   --  2,189*  --   --  715*  --   --   TROPONINIHS 1,088*  --  941*  --   --   --   --   --   --   --    < > = values in this interval not displayed.    Estimated Creatinine Clearance: 20.8 mL/min (A) (by C-G formula based on SCr of 1.24 mg/dL (H)).  Assessment: 87 yr old woman with new-onset atrial fibrillation being treated with IV heparin (pt was not on anticoagulant PTA).   Confirmatory heparin level drawn ~7.5 hrs after initial therapeutic heparin level earlier today was 0.40 units/ml, which remains within the goal range for this pt. H/H, plt trending down, potentially due to dilutional effect from IV fluids. (5+ liters); Scr trending down. Per RN, no overt signs and symptoms of bleeding or issues with IV currently.   Goal of Therapy:  Heparin level 0.3-0.7  units/ml Monitor platelets by anticoagulation protocol: Yes   Plan:  Continue heparin infusion at 850 units/hr Monitor daily heparin level, CBC Monitor for bleeding F/U long-term anticoagulation plan  Gillermina Hu, PharmD, BCPS, [facility] Clinical Pharmacist 10/05/2021,5:48 PM

## 2021-10-05 NOTE — Progress Notes (Signed)
.  [facility] ADULT ICU REPLACEMENT PROTOCOL   The patient does apply for the [facility] Adult ICU Electrolyte Replacment Protocol based on the criteria listed below:   1.Exclusion criteria: TCTS patients, ECMO patients, and Dialysis patients 2. Is GFR >/= 30 ml/min? Yes.    Patient's GFR today is 42 3. Is SCr </= 2? Yes.   Patient's SCr is 1.24 mg/dL 4. Did SCr increase >/= 0.5 in 24 hours? No. 5.Pt's weight >40kg  Yes.   6. Abnormal electrolyte(s): K+ 3.4  7. Electrolytes replaced per protocol 8.  Call MD STAT for K+ </= 2.5, Phos </= 1, or Mag </= 1 Physician:  Kendell Bane 10/05/2021 4:35 AM

## 2021-10-05 NOTE — Evaluation (Signed)
Clinical/Bedside Swallow Evaluation Patient Details  Name: Kelly Webster MRN: [id number] Date of Birth: [birth year]  Today's Date: 10/05/2021 Time: SLP Start Time (ACUTE ONLY): 0850 SLP Stop Time (ACUTE ONLY): 0906 SLP Time Calculation (min) (ACUTE ONLY): 16 min  Past Medical History:  Past Medical History:  Diagnosis Date   Arthritis    gouty arthritis knees and hands   Diabetes mellitus without complication ([facility])    Hx of seasonal allergies    Hypertension    Past Surgical History:  Past Surgical History:  Procedure Laterality Date   ABDOMINAL HYSTERECTOMY     1981   APPENDECTOMY     1967   BREAST BIOPSY     BREAST LUMPECTOMY     right 2014   BREAST LUMPECTOMY WITH NEEDLE LOCALIZATION Right 07/27/2013   Procedure: BREAST LUMPECTOMY WITH NEEDLE LOCALIZATION;  Surgeon: Edward Jolly, MD;  Location: [facility];  Service: General;  Laterality: Right;   CARPAL TUNNEL RELEASE Left    1988   PILONIDAL CYST EXCISION     1957   HPI:  87 y/o F with history of diabetes and hypertension who presented to the ED after being found down at home. Last seen by family on Monday, 10/31, found 11/5. Family was unable to get ahold of her by phone through the week and found her lodged between her bed and wall with multiple pressure sores. Patient presents with  shock refractory to initial fluid resuscitation in the Emergency Departmetn and End organ dysfunction as manifested by acute kidney injury now status post  3L isotonic fluid resuscitation and empiric vanc/cefepime  in the emergency department. Mild basleine cognitive impiarment, CT shows remote cva.    Assessment / Plan / Recommendation  Clinical Impression  Pt upright and alert, dry coated lingual mucosa with secretions releasing with trials of water and ice. Vocal quality clear otherwise. Pt able to tolerate ice, water and puree with some volitional throat clearing , likely to clear coated secretions given expectoration  of dry particles. Pt will benefit from opportunities to swallow and hydrate oral and pharyngeal mucosa. Recommend purees and thins today; pt may need dentures prior to upgraded solids. Will f/u.      Aspiration Risk       Diet Recommendation Thin liquid   Liquid Administration via: Cup;Straw Medication Administration: Whole meds with puree Supervision: Staff to assist with self feeding Compensations: Slow rate;Small sips/bites Postural Changes: Seated upright at 90 degrees    Other  Recommendations Oral Care Recommendations: Oral care BID    Recommendations for follow up therapy are one component of a multi-disciplinary discharge planning process, led by the attending physician.  Recommendations may be updated based on patient status, additional functional criteria and insurance authorization.  Follow up Recommendations Skilled Nursing facility      Frequency and Duration min 2x/week  2 weeks       Prognosis Prognosis for Safe Diet Advancement: Good      Swallow Study   General HPI: 87 y/o F with history of diabetes and hypertension who presented to the ED after being found down at home. Last seen by family on Monday, 10/31, found 11/5. Family was unable to get ahold of her by phone through the week and found her lodged between her bed and wall with multiple pressure sores. Patient presents with  shock refractory to initial fluid resuscitation in the Emergency Departmetn and End organ dysfunction as manifested by acute kidney injury now status post  3L  isotonic fluid resuscitation and empiric vanc/cefepime  in the emergency department. Mild basleine cognitive impiarment, CT shows remote cva. Type of Study: Bedside Swallow Evaluation Diet Prior to this Study: NPO Temperature Spikes Noted: No Respiratory Status: Nasal cannula History of Recent Intubation: No Behavior/Cognition: Alert;Cooperative Oral Cavity Assessment: Dry;Dried secretions Oral Care Completed by SLP: Yes Oral  Cavity - Dentition: Poor condition;Missing dentition;Dentures, not available Vision: Functional for self-feeding Self-Feeding Abilities: Total assist Patient Positioning: Upright in chair Volitional Cough: Strong Volitional Swallow: Able to elicit    Oral/Motor/Sensory Function Overall Oral Motor/Sensory Function: Moderate impairment Facial ROM: Reduced left;Suspected CN VII (facial) dysfunction Facial Symmetry: Abnormal symmetry left;Suspected CN VII (facial) dysfunction Facial Strength: Reduced left;Suspected CN VII (facial) dysfunction Facial Sensation: Within Functional Limits Lingual ROM: Within Functional Limits Lingual Symmetry: Within Functional Limits Lingual Sensation: Within Functional Limits   Ice Chips Ice chips: Within functional limits Other Comments: some throat clearing efforts   Thin Liquid Thin Liquid: Within functional limits Presentation: Straw Other Comments: throat clearing to clear secretions    Nectar Thick Nectar Thick Liquid: Not tested   Honey Thick Honey Thick Liquid: Not tested   Puree Puree: Within functional limits Presentation: Spoon   Solid     Solid: Not tested      Lynann Beaver 10/05/2021,9:13 AM

## 2021-10-05 NOTE — Progress Notes (Signed)
ANTICOAGULATION CONSULT NOTE - Follow Up Consult  Pharmacy Consult for heparin monitoring.  Indication: atrial fibrillation  Allergies  Allergen Reactions   Demerol [Meperidine] Nausea Only    Patient Measurements: Height: 4\' 9"  (144.8 cm) Weight: 47.4 kg (104 lb 8 oz) IBW/kg (Calculated) : 38.6 Heparin Dosing Weight: 47.4 kg  Vital Signs: Temp: 98.3 F (36.8 C) (11/07 0722) Temp Source: Axillary (11/07 0722) BP: 98/61 (11/07 0915) Pulse Rate: 96 (11/07 0915)  Labs: Recent Labs    10/13/2021 1815 10/12/2021 1859 10/13/2021 2116 10/22/2021 2227 10/04/21 0258 10/04/21 1111 10/04/21 2153 10/05/21 0307 10/05/21 0913  HGB 10.5* 10.9*  --   --   --   --   --  8.6*  --   HCT 30.2* 32.0*  --   --   --   --   --  23.8*  --   PLT 329  --   --   --   --   --   --  252  --   APTT 39*  --   --   --   --   --   --   --   --   LABPROT 16.0*  --   --   --   --   --   --   --   --   INR 1.3*  --   --   --   --   --   --   --   --   HEPARINUNFRC  --   --   --   --   --  <0.10* <0.10*  --  0.33  CREATININE 2.19*  --   --  1.72*  --   --  1.31* 1.24*  --   CKTOTAL 2,841*  --   --   --  2,189*  --   --  715*  --   TROPONINIHS 1,088*  --  941*  --   --   --   --   --   --     Estimated Creatinine Clearance: 20.8 mL/min (A) (by C-G formula based on SCr of 1.24 mg/dL (H)).   Medications:  Scheduled:   Chlorhexidine Gluconate Cloth  6 each Topical Q0600   Gerhardt's butt cream   Topical BID   insulin aspart  0-9 Units Subcutaneous Q4H   mouth rinse  15 mL Mouth Rinse BID   mupirocin ointment   Topical Daily   Infusions:   sodium chloride 500 mL/hr at 10/04/21 1421   amiodarone 30 mg/hr (10/05/21 0900)   ceFEPime (MAXIPIME) IV Stopped (10/04/21 2224)   heparin 850 Units/hr (10/05/21 0900)   norepinephrine (LEVOPHED) Adult infusion 1.5 mcg/min (10/05/21 0900)   potassium chloride 10 mEq (10/05/21 0959)   [START ON 10/06/2021] vancomycin      Assessment: 89 YOF with new-onset atrial  fibrillation being treated with heparin (not on anticoagulation prior to admission). Most recent level in therapeutic range. Platelets and H&H trending down, potentially due to dilutional effect from IV fluids. No overt signs and symptoms of bleeding currently.   Goal of Therapy:  Heparin level 0.3-0.7 units/ml Monitor platelets by anticoagulation protocol: Yes   Plan:  Continue heparin at 850 units/hr. Continue to monitor H&H and platelets. Follow up heparin level @1600 .   Caren Macadam, PharmD Candidate, P4 10/05/2021,10:19 AM

## 2021-10-06 ENCOUNTER — Inpatient Hospital Stay (HOSPITAL_COMMUNITY): Payer: PPO

## 2021-10-06 DIAGNOSIS — I4891 Unspecified atrial fibrillation: Secondary | ICD-10-CM | POA: Diagnosis not present

## 2021-10-06 DIAGNOSIS — R579 Shock, unspecified: Secondary | ICD-10-CM | POA: Diagnosis not present

## 2021-10-06 DIAGNOSIS — R1110 Vomiting, unspecified: Secondary | ICD-10-CM | POA: Diagnosis not present

## 2021-10-06 DIAGNOSIS — M47816 Spondylosis without myelopathy or radiculopathy, lumbar region: Secondary | ICD-10-CM | POA: Diagnosis not present

## 2021-10-06 DIAGNOSIS — R0902 Hypoxemia: Secondary | ICD-10-CM | POA: Diagnosis not present

## 2021-10-06 LAB — HEPARIN LEVEL (UNFRACTIONATED): Heparin Unfractionated: 0.3 IU/mL (ref 0.30–0.70)

## 2021-10-06 LAB — BASIC METABOLIC PANEL
Anion gap: 10 (ref 5–15)
Anion gap: 12 (ref 5–15)
BUN: 78 mg/dL — ABNORMAL HIGH (ref 8–23)
BUN: 86 mg/dL — ABNORMAL HIGH (ref 8–23)
CO2: 23 mmol/L (ref 22–32)
CO2: 29 mmol/L (ref 22–32)
Calcium: 10.1 mg/dL (ref 8.9–10.3)
Calcium: 10.7 mg/dL — ABNORMAL HIGH (ref 8.9–10.3)
Chloride: 101 mmol/L (ref 98–111)
Chloride: 95 mmol/L — ABNORMAL LOW (ref 98–111)
Creatinine, Ser: 1.25 mg/dL — ABNORMAL HIGH (ref 0.44–1.00)
Creatinine, Ser: 1.45 mg/dL — ABNORMAL HIGH (ref 0.44–1.00)
GFR, Estimated: 41 mL/min — ABNORMAL LOW (ref >60)
GFR, Estimated: 35 mL/min — ABNORMAL LOW (ref >60)
Glucose, Bld: 124 mg/dL — ABNORMAL HIGH (ref 70–99)
Glucose, Bld: 112 mg/dL — ABNORMAL HIGH (ref 70–99)
Potassium: 4.4 mmol/L (ref 3.5–5.1)
Potassium: 3.7 mmol/L (ref 3.5–5.1)
Sodium: 134 mmol/L — ABNORMAL LOW (ref 135–145)
Sodium: 136 mmol/L (ref 135–145)

## 2021-10-06 LAB — CBC
HCT: 22.3 % — ABNORMAL LOW (ref 36.0–46.0)
HCT: 19 % — ABNORMAL LOW (ref 36.0–46.0)
Hemoglobin: 8 g/dL — ABNORMAL LOW (ref 12.0–15.0)
Hemoglobin: 7.1 g/dL — ABNORMAL LOW (ref 12.0–15.0)
MCH: 32.5 pg (ref 26.0–34.0)
MCH: 33 pg (ref 26.0–34.0)
MCHC: 35.9 g/dL (ref 30.0–36.0)
MCHC: 37.4 g/dL — ABNORMAL HIGH (ref 30.0–36.0)
MCV: 90.7 fL (ref 80.0–100.0)
MCV: 88.4 fL (ref 80.0–100.0)
Platelets: 263 10*3/uL (ref 150–400)
Platelets: 239 10*3/uL (ref 150–400)
RBC: 2.46 MIL/uL — ABNORMAL LOW (ref 3.87–5.11)
RBC: 2.15 MIL/uL — ABNORMAL LOW (ref 3.87–5.11)
RDW: 14.4 % (ref 11.5–15.5)
RDW: 13.8 % (ref 11.5–15.5)
WBC: 14.3 10*3/uL — ABNORMAL HIGH (ref 4.0–10.5)
WBC: 12.3 10*3/uL — ABNORMAL HIGH (ref 4.0–10.5)
nRBC: 0 % (ref 0.0–0.2)
nRBC: 0 % (ref 0.0–0.2)

## 2021-10-06 LAB — ABO/RH: ABO/RH(D): A POS

## 2021-10-06 LAB — URINE CULTURE: Culture: 1000 — AB

## 2021-10-06 LAB — GLUCOSE, CAPILLARY
Glucose-Capillary: 107 mg/dL — ABNORMAL HIGH (ref 70–99)
Glucose-Capillary: 109 mg/dL — ABNORMAL HIGH (ref 70–99)
Glucose-Capillary: 110 mg/dL — ABNORMAL HIGH (ref 70–99)
Glucose-Capillary: 133 mg/dL — ABNORMAL HIGH (ref 70–99)
Glucose-Capillary: 138 mg/dL — ABNORMAL HIGH (ref 70–99)
Glucose-Capillary: 84 mg/dL (ref 70–99)
Glucose-Capillary: 93 mg/dL (ref 70–99)

## 2021-10-06 LAB — PREPARE RBC (CROSSMATCH)

## 2021-10-06 LAB — MAGNESIUM: Magnesium: 2.1 mg/dL (ref 1.7–2.4)

## 2021-10-06 MED ORDER — MORPHINE 100MG IN NS 100ML (1MG/ML) PREMIX INFUSION
5.0000 mg/h | INTRAVENOUS | Status: DC
  Administered 2021-10-07: 5 mg/h via INTRAVENOUS
  Filled 2021-10-06: qty 100

## 2021-10-06 MED ORDER — NOREPINEPHRINE 4 MG/250ML-% IV SOLN: 2.0000 ug/min | INTRAVENOUS | Status: DC

## 2021-10-06 MED ORDER — LORAZEPAM 2 MG/ML IJ SOLN: 1.0000 mg | INTRAMUSCULAR | Status: DC | PRN

## 2021-10-06 MED ORDER — ALBUMIN HUMAN 25 % IV SOLN
12.5000 g | Freq: Once | INTRAVENOUS | Status: AC
  Administered 2021-10-06: 12.5 g via INTRAVENOUS
  Filled 2021-10-06: qty 50

## 2021-10-06 MED ORDER — SODIUM CHLORIDE 0.9% IV SOLUTION: Freq: Once | INTRAVENOUS | Status: DC

## 2021-10-06 MED ORDER — ONDANSETRON 4 MG PO TBDP: 4.0000 mg | ORAL_TABLET | Freq: Four times a day (QID) | ORAL | Status: DC | PRN

## 2021-10-06 MED ORDER — LORAZEPAM 1 MG PO TABS: 1.0000 mg | ORAL_TABLET | ORAL | Status: DC | PRN

## 2021-10-06 MED ORDER — HALOPERIDOL 0.5 MG PO TABS
0.5000 mg | ORAL_TABLET | ORAL | Status: DC | PRN
  Filled 2021-10-06: qty 1

## 2021-10-06 MED ORDER — SODIUM CHLORIDE 0.9 % IV SOLN
12.5000 mg | Freq: Four times a day (QID) | INTRAVENOUS | Status: DC | PRN
  Filled 2021-10-06: qty 0.5

## 2021-10-06 MED ORDER — ENSURE ENLIVE PO LIQD
237.0000 mL | Freq: Three times a day (TID) | ORAL | Status: DC
  Administered 2021-10-06: 237 mL via ORAL

## 2021-10-06 MED ORDER — MORPHINE BOLUS VIA INFUSION
2.0000 mg | INTRAVENOUS | Status: DC | PRN
  Filled 2021-10-06: qty 2

## 2021-10-06 MED ORDER — PANTOPRAZOLE SODIUM 40 MG IV SOLR
40.0000 mg | Freq: Every day | INTRAVENOUS | Status: DC
  Administered 2021-10-06: 40 mg via INTRAVENOUS
  Filled 2021-10-06: qty 40

## 2021-10-06 MED ORDER — ONDANSETRON HCL 4 MG/2ML IJ SOLN: 4.0000 mg | Freq: Four times a day (QID) | INTRAMUSCULAR | Status: DC | PRN

## 2021-10-06 MED ORDER — LORAZEPAM 2 MG/ML PO CONC: 1.0000 mg | ORAL | Status: DC | PRN

## 2021-10-06 MED ORDER — BIOTENE DRY MOUTH MT LIQD: 15.0000 mL | OROMUCOSAL | Status: DC | PRN

## 2021-10-06 MED ORDER — HALOPERIDOL LACTATE 2 MG/ML PO CONC
0.5000 mg | ORAL | Status: DC | PRN
  Filled 2021-10-06: qty 0.3

## 2021-10-06 MED ORDER — PANTOPRAZOLE SODIUM 40 MG PO TBEC
40.0000 mg | DELAYED_RELEASE_TABLET | Freq: Every day | ORAL | Status: DC
  Administered 2021-10-06: 40 mg via ORAL
  Filled 2021-10-06: qty 1

## 2021-10-06 MED ORDER — MIDODRINE HCL 5 MG PO TABS: 5.0000 mg | ORAL_TABLET | Freq: Three times a day (TID) | ORAL | Status: DC

## 2021-10-06 MED ORDER — ONDANSETRON HCL 4 MG PO TABS: 4.0000 mg | ORAL_TABLET | Freq: Four times a day (QID) | ORAL | Status: DC | PRN

## 2021-10-06 MED ORDER — AMIODARONE IV BOLUS ONLY 150 MG/100ML
150.0000 mg | Freq: Once | INTRAVENOUS | Status: AC
  Administered 2021-10-06: 150 mg via INTRAVENOUS
  Filled 2021-10-06: qty 100

## 2021-10-06 MED ORDER — HALOPERIDOL LACTATE 5 MG/ML IJ SOLN: 0.5000 mg | INTRAMUSCULAR | Status: DC | PRN

## 2021-10-06 MED ORDER — GLYCOPYRROLATE 0.2 MG/ML IJ SOLN: 0.2000 mg | INTRAMUSCULAR | Status: DC | PRN

## 2021-10-06 MED ORDER — ACETAMINOPHEN 325 MG PO TABS: 650.0000 mg | ORAL_TABLET | Freq: Four times a day (QID) | ORAL | Status: DC | PRN

## 2021-10-06 MED ORDER — LACTATED RINGERS IV BOLUS
1000.0000 mL | Freq: Once | INTRAVENOUS | Status: AC
  Administered 2021-10-06: 1000 mL via INTRAVENOUS

## 2021-10-06 MED ORDER — GLYCOPYRROLATE 1 MG PO TABS: 1.0000 mg | ORAL_TABLET | ORAL | Status: DC | PRN

## 2021-10-06 MED ORDER — ACETAMINOPHEN 650 MG RE SUPP: 650.0000 mg | Freq: Four times a day (QID) | RECTAL | Status: DC | PRN

## 2021-10-06 MED ORDER — ACETAMINOPHEN 325 MG PO TABS
650.0000 mg | ORAL_TABLET | Freq: Once | ORAL | Status: AC
  Administered 2021-10-06: 650 mg via ORAL
  Filled 2021-10-06: qty 2

## 2021-10-06 MED ORDER — POLYVINYL ALCOHOL 1.4 % OP SOLN
1.0000 [drp] | Freq: Four times a day (QID) | OPHTHALMIC | Status: DC | PRN
  Filled 2021-10-06: qty 15

## 2021-10-06 NOTE — Progress Notes (Signed)
RN called to room for family complaining of "patient throwing up." Patient's emesis is brown. About 100 ml of brown emesis sucked into canister on wall. Patient oxygen saturation reading 88% on room air, 2L [place] oxygen placed on patient. Titrated up to 4L.  Will notify MD & continue to monitor.   Kelly Webster

## 2021-10-06 NOTE — Progress Notes (Addendum)
NAME:  Kelly Webster, MRN:  [id number], DOB:  1931/01/30, LOS: 3 ADMISSION DATE:  10/06/2021, CONSULTATION DATE:  10/04/2021 REFERRING MD:  EDP, CHIEF COMPLAINT:  Shock   History of Present Illness:  [name] is an 87 year old female with PMHx of hypertension and diabetes who presented to the ED after being found down at home. Last seen by family on 10/31, family found her lodged between her bed and wall with multiple pressure sores on 11/5 and brought her in to the ED.  Patient is hard of hearing and has difficulty providing addisional history. She states that she had a bad fall at home but unable to further elaborate.  In the ED, patient noted to be hypotensive. Initially responded to IVF but eventually required initiation of norepinephrine. Broad spectrum antibiotics started but no obvious source of infection ntoed.   Pertinent  Medical History   Past Medical History:  Diagnosis Date   Arthritis    gouty arthritis knees and hands   Diabetes mellitus without complication ([place])    Hx of seasonal allergies    Hypertension    Significant Hospital Events: Including procedures, antibiotic start and stop dates in addition to other pertinent events   11/6> admitted to ICU in setting of shock 11/7> continuing to require pressor support   Interim History / Subjective:  Overnight, no acute events. This morning, patient is resting comfortably in bed. She remains on levophed to maintain MAP >65. No acute distress   Objective   Blood pressure (!) 98/51, pulse (!) 115, temperature 97.7 F (36.5 C), temperature source Oral, resp. rate (!) 21, height 4\' 9"  (1.448 m), weight 48.1 kg, SpO2 96 %.        Intake/Output Summary (Last 24 hours) at 10/06/2021 0737 Last data filed at 10/06/2021 0017 Gross per 24 hour  Intake 1033.77 ml  Output 780 ml  Net 253.77 ml   Filed Weights   10/04/21 0351 10/05/21 0515 10/06/21 0500  Weight: 45.2 kg 47.4 kg 48.1 kg    Examination: General:  chronically ill appearing female; not in acute distress HENT: Normocephalic; 2cm lesion above the right eyebrow with dried blood; bruising noted on the left cheek. EOMI, PERRL, dry mucus membranes Lungs: CTAB, on room air Cardiovascular: Irregularly irregular, tachycardic, S1 and S2 present Abdomen: soft, nondistended, nontender, +BS Extremities: diffuse anasarca;  warm and dry; diffuse muscle rigidity Neuro: alert and oriented x3, mildly encephalopathic but unclear if this is baseline   Resolved Hospital Problem list    Assessment & Plan:  Hypovolemic shock - improving Patient presented with hypovolemic shock in setting of being found down for >48 hours. She has received 5+L IVF since admission. Continuing to require vasopressor support to maintain MAP>65. Titrating down. Mucus membranes continue to appear dry.  Significant third spacing noted on exam. May have component of autonomic dysfunction - Start midodrine 5mg  tid - Wean off levophed as able  - Encourage PO intake - IV albumin 25% 12.5g  - Blood cultures neg to date   Atrial fibrillation with RVR No hx of atrial fibrillation. This is likely in setting of shock as above with levophed. Currently rate controlled. CHADs2-VASc score 5; HASBLED score 2 with history of frequent falls; leading to increased risk of bleeding. If atrial fibrillation persists, patient likely poor candidate for long term anticoagulation unless goes to SNF.  - PO amiodarone  - Continue IV heparin gtt - Electrolyte replacement for goal K >4, Mg >2  Acute renal  failure, oliguric Hypokalemia Rhabdomyolysis  Serum creatinine and CK improving with fluid resuscitation. Patient still remains oliguric but improving. - Continue to monitor renal function - Encourage oral hydration - Strict I&O - Monitor and replete electrolytes  - Avoid nephrotoxic agents   Hypovolemic hyponatremia  2/2 severe dehydration as above. Na improved to 134 this AM - Continue to  trend  Normocytic anemia Likely dilutional in setting of IV hydration. No signs of acute bleed at this time - Continue to trend CBC  Best Practice (right click and "Reselect all SmartList Selections" daily)   Diet/type: dysphagia diet (see orders) DVT prophylaxis: systemic heparin GI prophylaxis: N/A Lines: N/A Foley:  N/A Code Status:  DNR Last date of multidisciplinary goals of care discussion [family to be updated 11/8]  Labs   CBC: Recent Labs  Lab 09/30/2021 1815 10/26/2021 1859 10/05/21 0307 10/06/21 0303  WBC 21.9*  --  16.0* 14.3*  NEUTROABS 19.8*  --   --   --   HGB 10.5* 10.9* 8.6* 8.0*  HCT 30.2* 32.0* 23.8* 22.3*  MCV 91.5  --  89.8 90.7  PLT 329  --  252 062    Basic Metabolic Panel: Recent Labs  Lab 10/02/2021 1815 10/23/2021 1859 10/04/2021 2227 10/04/21 0214 10/04/21 2153 10/05/21 0307 10/06/21 0303  NA 131* 129* 129*  --  132* 132* 134*  K 3.8 3.8 3.8  --  3.3* 3.4* 4.4  CL 95*  --  100  --  99 102 101  CO2 22  --  20*  --  24 23 23   GLUCOSE 146*  --  119*  --  149* 124* 124*  BUN 99*  --  91*  --  74* 75* 78*  CREATININE 2.19*  --  1.72*  --  1.31* 1.24* 1.25*  CALCIUM 10.5*  --  9.0  --  10.0 9.9 10.1  MG 3.1*  --   --  2.5* 2.3  --  2.1  PHOS  --   --   --  3.2  --   --   --    GFR: Estimated Creatinine Clearance: 20.8 mL/min (A) (by C-G formula based on SCr of 1.25 mg/dL (H)). Recent Labs  Lab 10/02/2021 1815 10/13/2021 2117 10/04/21 0214 10/05/21 0307 10/06/21 0303  WBC 21.9*  --   --  16.0* 14.3*  LATICACIDVEN  --  1.7 1.0  --   --     Liver Function Tests: Recent Labs  Lab 10/02/2021 1815  AST 116*  ALT 73*  ALKPHOS 66  BILITOT 0.7  PROT 6.0*  ALBUMIN 2.7*   No results for input(s): LIPASE, AMYLASE in the last 168 hours. No results for input(s): AMMONIA in the last 168 hours.  ABG    Component Value Date/Time   HCO3 23.6 10/08/2021 1859   TCO2 25 10/17/2021 1859   ACIDBASEDEF 2.0 09/29/2021 1859   O2SAT 84.0 10/24/2021 1859      Coagulation Profile: Recent Labs  Lab 10/15/2021 1815  INR 1.3*    Cardiac Enzymes: Recent Labs  Lab 10/17/2021 1815 10/04/21 0258 10/05/21 0307  CKTOTAL 2,841* 2,189* 715*    HbA1C: Hgb A1c MFr Bld  Date/Time Value Ref Range Status  10/04/2021 03:27 AM 5.7 (H) 4.8 - 5.6 % Final    Comment:    (NOTE) Pre diabetes:          5.7%-6.4%  Diabetes:              >6.4%  Glycemic control  for   <7.0% adults with diabetes   07/16/2015 05:25 AM 6.1 (H) 4.8 - 5.6 % Final    Comment:    (NOTE)         Pre-diabetes: 5.7 - 6.4         Diabetes: >6.4         Glycemic control for adults with diabetes: <7.0     CBG: Recent Labs  Lab 10/05/21 1519 10/05/21 1950 10/06/21 0001 10/06/21 0344 10/06/21 0716  GLUCAP 120* 116* 107* 109* 138*    Critical care time: 35 mins    Kelly Heck, MD Internal Medicine, PGY-3 10/06/21 7:37 AM Pager # [phone]

## 2021-10-06 NOTE — Evaluation (Signed)
Occupational Therapy Evaluation Patient Details Name: Kelly Webster MRN: [id number] DOB: 1931/07/16 Today's Date: 10/06/2021   History of Present Illness 87 yo admitted 11/5 after found down at home wedged between bed and wall with pressure sores. Last seen by family 10/31. Pt with septic shock and Afib. PMhx: HTN, DM, HOH, arthritis   Clinical Impression   PTA patient independent and living alone. Admitted for above and presenting with problem list below, including generalized weakness with edema and decreased functional use of bilateral UEs, decreased activity tolerance, impaired balance, impaired cognition.  She is oriented to self, inconsistently follows simple commands, slow processing and decreased initiation, and has poor awareness. She currently requires total assist +2 for bed mobility, total assist +2 for ADLs, and transfers.  She will benefit from continued OT services while admitted and after dc at SNF level to optimize independence, safety and return to PLOF.  Will follow.    BP pre: 107/89 (95), post 89/66 (75) in chair -- dropped to 80/45 (56) with sitting reclined in chair for 3 minutes and RN notified, present in room upon exit with BP increasing (started further meds)   HR 120-145 SpO2 95% RA    Recommendations for follow up therapy are one component of a multi-disciplinary discharge planning process, led by the attending physician.  Recommendations may be updated based on patient status, additional functional criteria and insurance authorization.   Follow Up Recommendations  Skilled nursing-short term rehab (<3 hours/day)    Assistance Recommended at Discharge Frequent or constant Supervision/Assistance  Functional Status Assessment  Patient has had a recent decline in their functional status and demonstrates the ability to make significant improvements in function in a reasonable and predictable amount of time.  Equipment Recommendations  Other (comment) (defer to next  venue)    Recommendations for Other Services       Precautions / Restrictions Precautions Precautions: Fall Precaution Comments: laceration right eyebrow, wound at right shoulder, pelvis and toes Restrictions Weight Bearing Restrictions: No      Mobility Bed Mobility Overal bed mobility: Needs Assistance Bed Mobility: Supine to Sit Rolling: Total assist Sidelying to sit: Total assist;+2 for physical assistance       General bed mobility comments: total assist to roll and +2 assist to achieve sitting. Posterior right lean in sitting. EOB 14 min with no righting reaction and max assist for sitting balance    Transfers Overall transfer level: Needs assistance Equipment used: 2 person hand held assist Transfers: Sit to/from Stand Sit to Stand: Max assist;+2 physical assistance   Squat pivot transfers: Total assist;+2 physical assistance       General transfer comment: bil knees blocked with use of pad to cradle sacrum and allow rise to weight bearing without knee buckling. Anterior flexed trunk in standing with no activation of bil UE movement to assist with standing. Total assist +2 to pivot to chair with pad.      Balance Overall balance assessment: Needs assistance Sitting-balance support: No upper extremity supported;Feet supported Sitting balance-Leahy Scale: Zero Sitting balance - Comments: posterior lean with max assist for sitting balance   Standing balance support: No upper extremity supported Standing balance-Leahy Scale: Zero Standing balance comment: bil knees blocked, anterior lean and total assist for standing balance                           ADL either performed or assessed with clinical judgement   ADL Overall ADL's :  Needs assistance/impaired     Grooming: Total assistance;Brushing hair;Wash/dry face;Sitting Grooming Details (indicate cue type and reason): sitting EOB with total assist using BUEs to engage in grooming Upper Body  Bathing: Total assistance;Sitting   Lower Body Bathing: Total assistance;Sitting/lateral leans;+2 for physical assistance;+2 for safety/equipment   Upper Body Dressing : Total assistance;Sitting   Lower Body Dressing: Total assistance;+2 for physical assistance;+2 for safety/equipment;Sitting/lateral leans;Bed level   Toilet Transfer: Total assistance;+2 for physical assistance;+2 for safety/equipment           Functional mobility during ADLs: Total assistance;+2 for physical assistance;+2 for safety/equipment General ADL Comments: pt limited by weakness, cognition, balance     Vision   Additional Comments: able scan and locate therapist, continue assessment (limited due to cog)     Perception     Praxis      Pertinent Vitals/Pain Pain Assessment: CPOT Facial Expression: Relaxed, neutral Body Movements: Absence of movements Muscle Tension: Relaxed Compliance with ventilator (intubated pts.): N/A Vocalization (extubated pts.): Talking in normal tone or no sound CPOT Total: 0     Hand Dominance Right   Extremity/Trunk Assessment Upper Extremity Assessment Upper Extremity Assessment: RUE deficits/detail;LUE deficits/detail RUE Deficits / Details: anterior shoulder bruise/wound, edema; able to squeeze hand but no AROM noted at shoulder/elbow; PROM WFL.  arthritic joints in hand. RUE Coordination: decreased fine motor;decreased gross motor LUE Deficits / Details: edema; able to squeeze hand but very limited AROM noted at shoulder/elbow; PROM WFL.  arthritic joints in hand. LUE Coordination: decreased fine motor;decreased gross motor   Lower Extremity Assessment Lower Extremity Assessment: Defer to PT evaluation   Cervical / Trunk Assessment Cervical / Trunk Assessment: Kyphotic   Communication Communication Communication: HOH   Cognition Arousal/Alertness: Awake/alert Behavior During Therapy: Flat affect Overall Cognitive Status: Difficult to assess Area of  Impairment: Orientation;Attention;Memory;Safety/judgement;Following commands;Problem solving;Awareness                 Orientation Level: Disoriented to;Time;Situation Current Attention Level: Focused Memory: Decreased short-term memory Following Commands: Follows one step commands inconsistently;Follows one step commands with increased time Safety/Judgement: Decreased awareness of safety;Decreased awareness of deficits Awareness: Intellectual Problem Solving: Slow processing;Decreased initiation;Difficulty sequencing;Requires verbal cues;Requires tactile cues General Comments: pt HOH with bil hearing aides, but very inconsistnet with responses and engagement.  She has difficulty following commands, intiating, sequencing and with poor awareness.  She initally voices "I need to see if I have pancake mix".     General Comments  vitals monitored during session, see clinical impression    Exercises     Shoulder Instructions      Home Living Family/patient expects to be discharged to:: Private residence Living Arrangements: Alone Available Help at Discharge: Family;Available PRN/intermittently Type of Home: House       Home Layout: One level     Bathroom Shower/Tub: Teacher, early years/pre: Handicapped height     Home Equipment: [place] - single point          Prior Functioning/Environment Prior Level of Function : Independent/Modified Independent                        OT Problem List: Decreased strength;Decreased range of motion;Decreased activity tolerance;Impaired balance (sitting and/or standing);Impaired vision/perception;Decreased coordination;Decreased cognition;Decreased safety awareness;Decreased knowledge of use of DME or AE;Decreased knowledge of precautions;Cardiopulmonary status limiting activity;Impaired UE functional use;Increased edema      OT Treatment/Interventions: Self-care/ADL training;Therapeutic exercise;DME and/or AE  instruction;Therapeutic activities;Cognitive remediation/compensation;Patient/family education;Balance training  OT Goals(Current goals can be found in the care plan section) Acute Rehab OT Goals Patient Stated Goal: none stated OT Goal Formulation: Patient unable to participate in goal setting Time For Goal Achievement: 10/20/21 Potential to Achieve Goals: Good  OT Frequency: Min 3X/week   Barriers to D/C:            Co-evaluation PT/OT/SLP Co-Evaluation/Treatment: Yes Reason for Co-Treatment: Complexity of the patient's impairments (multi-system involvement);Necessary to address cognition/behavior during functional activity;For patient/therapist safety;To address functional/ADL transfers PT goals addressed during session: Mobility/safety with mobility OT goals addressed during session: ADL's and self-care      AM-PAC OT "6 Clicks" Daily Activity     Outcome Measure Help from another person eating meals?: Total Help from another person taking care of personal grooming?: Total Help from another person toileting, which includes using toliet, bedpan, or urinal?: Total Help from another person bathing (including washing, rinsing, drying)?: Total Help from another person to put on and taking off regular upper body clothing?: Total Help from another person to put on and taking off regular lower body clothing?: Total 6 Click Score: 6   End of Session Nurse Communication: Mobility status  Activity Tolerance: Patient limited by fatigue Patient left: with call bell/phone within reach;in chair;with chair alarm set;with nursing/sitter in room  OT Visit Diagnosis: Other abnormalities of gait and mobility (R26.89);Muscle weakness (generalized) (M62.81);History of falling (Z91.81);Other symptoms and signs involving cognitive function                Time: 0926-1006 OT Time Calculation (min): 40 min Charges:  OT General Charges $OT Visit: 1 Visit OT Evaluation $OT Eval Moderate  Complexity: 1 Mod OT Treatments $Self Care/Home Management : 8-22 mins  Jolaine Artist, OT Acute Rehabilitation Services Pager [phone] Office [phone]   Delight Stare 10/06/2021, 11:27 AM

## 2021-10-06 NOTE — Progress Notes (Signed)
Speech Language Pathology Treatment: Dysphagia  Patient Details Name: Kelly Webster MRN: [id number] DOB: 05-Jul-1931 Today's Date: 10/06/2021 Time: 5883-2549 SLP Time Calculation (min) (ACUTE ONLY): 22 min  Assessment / Plan / Recommendation Clinical Impression  Pt seen for ongoing dysphagia management. RN reports poor PO intake.  Pt refusing POs and not chewing/swallowing food placed in oral cavity.  Pt seated upright at bedside chair with dry cough at fairly regular intervals.  Oral mucosa pink and moist today.  Pt was confused/agitated and refused PO trials initially, because she wanted to stand up.  Helped RN transfer pt from chair to bed.  Pt accepted PO trials after repositioning.  There were no clinical s/s of aspiration with PO trials.  Pt did not continue to exhibit dry cough noted earlier.  With puree, simulated ground consistency, and regular solid there was prolonged bolus formation, impaired mastication and oral residue on L side of oral cavity increasing with advancing textures.  Pt benefited from liquid was to help clear oral residue.  Pt became somewhat agitated again and asked to be taken out of bed.   Recommend continuing puree diet with thin liquid; however, pt may not be able to meet her nutritional needs orally given refusal of POs.    HPI HPI: 87 y/o F with history of diabetes and hypertension who presented to the ED after being found down at home. Last seen by family on Monday, 10/31, found 11/5. Family was unable to get ahold of her by phone through the week and found her lodged between her bed and wall with multiple pressure sores. Patient presents with  shock refractory to initial fluid resuscitation in the Emergency Departmetn and End organ dysfunction as manifested by acute kidney injury now status post  3L isotonic fluid resuscitation and empiric vanc/cefepime  in the emergency department. Mild basleine cognitive impiarment, CT shows remote cva.      SLP Plan  Continue  with current plan of care      Recommendations for follow up therapy are one component of a multi-disciplinary discharge planning process, led by the attending physician.  Recommendations may be updated based on patient status, additional functional criteria and insurance authorization.    Recommendations  Diet recommendations: Dysphagia 1 (puree);Thin liquid Liquids provided via: Cup;Straw Medication Administration: Whole meds with puree Supervision: Trained caregiver to feed patient Postural Changes and/or Swallow Maneuvers: Seated upright 90 degrees                Oral Care Recommendations: Oral care BID Follow Up Recommendations: Skilled nursing-short term rehab (<3 hours/day) Assistance recommended at discharge: Frequent or constant Supervision/Assistance SLP Visit Diagnosis: Dysphagia, unspecified (R13.10) Plan: Continue with current plan of care       [place], [place], [place] Office: [phone]  10/06/2021, 12:20 PM

## 2021-10-06 NOTE — Progress Notes (Signed)
Nutrition Brief Note  Chart reviewed and patient assessed d/t discussion in ICU rounds 11/8. Patient eating very poorly.   Nutrition Focused Physical Exam:  Flowsheet Row Most Recent Value  Orbital Region Severe depletion  Upper Arm Region Moderate depletion  Thoracic and Lumbar Region Severe depletion  Buccal Region Severe depletion  Temple Region Severe depletion  Clavicle Bone Region Severe depletion  Clavicle and Acromion Bone Region Severe depletion  Scapular Bone Region Severe depletion  Dorsal Hand No depletion  [edematous]  Patellar Region Moderate depletion  Anterior Thigh Region Moderate depletion  Posterior Calf Region Moderate depletion  Edema (RD Assessment) Mild  Hair Reviewed  Eyes Reviewed  Mouth Reviewed  Skin Reviewed  Nails Reviewed      Nutrition Diagnosis: Severe malnutrition r/t acute illness as evidenced by severe depletion of muscle and subcutaneous fat mass.   Pt now transitioning to comfort care.  No nutrition interventions planned at this time.  Please consult as needed.   Lucas Mallow, RD, LDN, CNSC Please refer to [facility] for contact information.

## 2021-10-06 NOTE — Progress Notes (Addendum)
Interval Progress Note:  Patient evaluated at bedside for concerns of patient now vomiting and new onset hypoxia. Patient noted to have ~100cc brown emesis in canister on the wall although has not eaten anything brown.   Physical exam: Blood pressure 107/68, pulse 89, temperature (!) 97.5 F (36.4 C), temperature source Axillary, resp. rate (!) 32, height 4\' 9"  (1.448 m), weight 48.1 kg, SpO2 90 %. Unchanged from prior except currently requiring 6L O2 via HFNC with SpO2 ~85%. Mildly tachycardic in low 100's. Lungs with diffuse rhonchi.  Brown emesis noted in the cannister and across patient chest. She is awake and alert. Weak cough and unable to clear airway.    ASSESSMENT/PLAN: Concern for possible aspiration pneumonia in setting of possible GI bleed given color of emesis.  - STAT CXR and KUB - STAT CBC - IV Protonix 40mg  bid - IV zofran 4mg  prn  - Diet NPO   ADDENDUM 4:20PM Son updated at bedside. Patient with ongoing brown emesis and worsening desaturation. Discussed with son at bedside who reports that patient would NOT want any life prolonging measures. In setting of ongoing desaturations, discussed planned interventions and to continue treatment if indicated. If worsening clinical status, will focus on comfort. Son is in agreement at this time.   ADDENDUM 5:50PM Son would like to proceed with comfort care at this time. Appropriate orders placed.

## 2021-10-06 NOTE — Progress Notes (Signed)
Physical Therapy Treatment Patient Details Name: Kelly Webster MRN: [id number] DOB: Mar 14, 1931 Today's Date: 10/06/2021   History of Present Illness 87 yo admitted 11/5 after found down at home wedged between bed and wall with pressure sores. Last seen by family 10/31. Pt with septic shock and Afib. PMhx: HTN, DM, HOH, arthritis    PT Comments    Pt seen in conjunction with OT with initial difficulty due to hearing aids not in properly. Pt maintains generalized weakness and stiffness with very limited muscle contraction throughout. Pt required total assist to bend left knee to roll and then after movement initiated knee appeared stuck in flexion with assist to move to extension. Pt continues to require total assist for mobility and not significant progression from prior date. Pt with noted partial throat clearing and gurgling which was not present prior date. Pt also with hypotension in sitting with RN aware and restarting levo. See OT note for vitals. Will continue to follow and attempt progression of strength and function.     Recommendations for follow up therapy are one component of a multi-disciplinary discharge planning process, led by the attending physician.  Recommendations may be updated based on patient status, additional functional criteria and insurance authorization.  Follow Up Recommendations  Skilled nursing-short term rehab (<3 hours/day)     Assistance Recommended at Discharge Frequent or [facility] bed;Wheelchair (measurements PT);BSC/3in1;Rolling walker (2 wheels)    Recommendations for Other Services       Precautions / Restrictions Precautions Precautions: Fall Precaution Comments: laceration right eyebrow, wound at right shoulder, pelvis and toes     Mobility  Bed Mobility Overal bed mobility: Needs Assistance Bed Mobility: Supine to Sit Rolling: Total assist Sidelying to sit: Total assist;+2 for  physical assistance       General bed mobility comments: total assist to roll and +2 assist to achieve sitting. Posterior right lean in sitting. EOB 14 min with no righting reaction and max assist for sitting balance    Transfers Overall transfer level: Needs assistance   Transfers: Sit to/from Stand;Bed to chair/wheelchair/BSC Sit to Stand: Max assist;+2 physical assistance   Squat pivot transfers: Total assist;+2 physical assistance       General transfer comment: bil knees blocked with use of pad to cradle sacrum and allow rise to weight bearing without knee buckling. Anterior flexed trunk in standing with no activation of bil UE movement to assist with standing. Total assist +2 to pivot to chair with pad. Attempted standing from chair with pt without activation from low surface    Ambulation/Gait               General Gait Details: unable   Stairs             Wheelchair Mobility    Modified Rankin (Stroke Patients Only)       Balance Overall balance assessment: Needs assistance Sitting-balance support: No upper extremity supported;Feet supported Sitting balance-Leahy Scale: Zero Sitting balance - Comments: posterior lean with max assist for sitting balance   Standing balance support: No upper extremity supported Standing balance-Leahy Scale: Zero Standing balance comment: bil knees blocked, anterior lean and total assist for standing balance                            Cognition Arousal/Alertness: Awake/alert Behavior During Therapy: Flat affect Overall Cognitive Status: Difficult to assess Area of Impairment: Orientation;Attention;Memory;Safety/judgement;Following commands;Problem solving  Orientation Level: Disoriented to;Time;Situation Current Attention Level: Focused Memory: Decreased short-term memory Following Commands: Follows one step commands inconsistently;Follows one step commands with increased  time Safety/Judgement: Decreased awareness of safety;Decreased awareness of deficits   Problem Solving: Slow processing General Comments: pt HOH with bil hearing aids and responding to questions and commands grossly 30% of the time with repetition and delay. pt initially perseverating on "need to see if I have pancake mix".        Exercises      General Comments        Pertinent Vitals/Pain Pain Assessment: CPOT Facial Expression: Relaxed, neutral Body Movements: Absence of movements Muscle Tension: Relaxed Compliance with ventilator (intubated pts.): N/A Vocalization (extubated pts.): Talking in normal tone or no sound CPOT Total: 0    Home Living                          Prior Function            PT Goals (current goals can now be found in the care plan section) Progress towards PT goals: Not progressing toward goals - comment    Frequency    Min 3X/week      PT Plan Current plan remains appropriate    Co-evaluation PT/OT/SLP Co-Evaluation/Treatment: Yes Reason for Co-Treatment: Complexity of the patient's impairments (multi-system involvement);For patient/therapist safety PT goals addressed during session: Mobility/safety with mobility        AM-PAC PT "6 Clicks" Mobility   Outcome Measure  Help needed turning from your back to your side while in a flat bed without using bedrails?: Total Help needed moving from lying on your back to sitting on the side of a flat bed without using bedrails?: Total Help needed moving to and from a bed to a chair (including a wheelchair)?: Total Help needed standing up from a chair using your arms (e.g., wheelchair or bedside chair)?: Total Help needed to walk in hospital room?: Total Help needed climbing 3-5 steps with a railing? : Total 6 Click Score: 6    End of Session   Activity Tolerance: Patient tolerated treatment well Patient left: in chair;with call bell/phone within reach;with chair alarm  set Nurse Communication: Mobility status PT Visit Diagnosis: Other abnormalities of gait and mobility (R26.89);Difficulty in walking, not elsewhere classified (R26.2);Muscle weakness (generalized) (M62.81);History of falling (Z91.81);Other symptoms and signs involving the nervous system (R29.898);Unsteadiness on feet (R26.81)     Time: 1443-1540 PT Time Calculation (min) (ACUTE ONLY): 37 min  Charges:  $Therapeutic Activity: 8-22 mins                     Deepti Gunawan P, PT Acute Rehabilitation Services Pager: [phone] Office: [place] 10/06/2021, 10:26 AM

## 2021-10-06 NOTE — Progress Notes (Signed)
Per conversation with Harvie Heck, MD and patient's son, we will allow family to visit tomorrow morning before fully proceeding with comfort measures.   Kelly Webster

## 2021-10-07 DIAGNOSIS — R579 Shock, unspecified: Secondary | ICD-10-CM

## 2021-10-07 DIAGNOSIS — Z23 Encounter for immunization: Secondary | ICD-10-CM | POA: Diagnosis not present

## 2021-10-07 LAB — BASIC METABOLIC PANEL
Anion gap: 11 (ref 5–15)
BUN: 89 mg/dL — ABNORMAL HIGH (ref 8–23)
CO2: 34 mmol/L — ABNORMAL HIGH (ref 22–32)
Calcium: 10.1 mg/dL (ref 8.9–10.3)
Chloride: 94 mmol/L — ABNORMAL LOW (ref 98–111)
Creatinine, Ser: 1.51 mg/dL — ABNORMAL HIGH (ref 0.44–1.00)
GFR, Estimated: 33 mL/min — ABNORMAL LOW (ref >60)
Glucose, Bld: 96 mg/dL (ref 70–99)
Potassium: 3.7 mmol/L (ref 3.5–5.1)
Sodium: 139 mmol/L (ref 135–145)

## 2021-10-07 LAB — CBC
HCT: 15.7 % — ABNORMAL LOW (ref 36.0–46.0)
Hemoglobin: 5.5 g/dL — CL (ref 12.0–15.0)
MCH: 31.4 pg (ref 26.0–34.0)
MCHC: 35 g/dL (ref 30.0–36.0)
MCV: 89.7 fL (ref 80.0–100.0)
Platelets: 204 10*3/uL (ref 150–400)
RBC: 1.75 MIL/uL — ABNORMAL LOW (ref 3.87–5.11)
RDW: 13.9 % (ref 11.5–15.5)
WBC: 15.8 10*3/uL — ABNORMAL HIGH (ref 4.0–10.5)
nRBC: 0.1 % (ref 0.0–0.2)

## 2021-10-07 LAB — HEPARIN LEVEL (UNFRACTIONATED): Heparin Unfractionated: <0.1 IU/mL — ABNORMAL LOW (ref 0.30–0.70)

## 2021-10-07 LAB — GLUCOSE, CAPILLARY: Glucose-Capillary: 91 mg/dL (ref 70–99)

## 2021-10-08 LAB — TYPE AND SCREEN
ABO/RH(D): A POS
Antibody Screen: NEGATIVE
Unit division: 0

## 2021-10-08 LAB — CULTURE, BLOOD (ROUTINE X 2)
Culture: NO GROWTH
Culture: NO GROWTH
Special Requests: ADEQUATE

## 2021-10-08 LAB — BPAM RBC
Blood Product Expiration Date: 202211212359
Unit Type and Rh: 6200

## 2021-10-29 NOTE — Progress Notes (Signed)
Comfort care initiated.  Morphine started, O2 discontinued and removed OG.  Family at bedside.  Support given to patient and family.

## 2021-10-29 NOTE — Progress Notes (Signed)
[facility] Progress Note Patient Name: Kelly Webster DOB: 13-Apr-1931 MRN: [id number]   Date of Service  10/30/2021  HPI/Events of Note  Hemoglobin is 5.5 gm / dl but patient is a "comfort care" patient with family arriving this morning.  eICU Interventions  Will defer blood transfusion.        Kerry Kass Carlen Rebuck 2021/10/30, 4:14 AM

## 2021-10-29 NOTE — Progress Notes (Signed)
   November 04, 2021 1325  Clinical Encounter Type  Visited With Patient and family together  Visit Type Initial;Spiritual support  Referral From Nurse  Consult/Referral To Chaplain   Chaplain Jorene Guest responded to the consult request for an Advance Directive. There were five family members at the patient's bedside. One of the family members pointed out the patient's son Elenore Rota. He stated the patient has HCPOA on file. Chaplain provided hospitality to the family and offered prayer. Chaplain advised will remain available for follow-up spiritual and emotional support. This note was prepared by Jeanine Luz, M.Div..  For questions please contact by phone [phone].

## 2021-10-29 NOTE — Death Summary Note (Signed)
DEATH SUMMARY   Patient Details  Name: Kelly Webster MRN: [id number] DOB: [birth year]  Admission/Discharge Information   Admit Date:  10/27/21  Date of Death: Date of Death: 10/31/21  Time of Death: Time of Death: 03/08/1701  Length of Stay: 4  Referring Physician: Leanna Battles, MD   Reason(s) for Hospitalization  Found down with numerous pressure injuries  Diagnoses  Preliminary cause of death: GIB (gastrointestinal bleeding) Secondary Diagnoses (including complications and co-morbidities):  Active Problems:   Atrial fibrillation with rapid ventricular response (HCC)   Shock [facility])   Brief Hospital Course (including significant findings, care, treatment, and services provided and events leading to death)  [facility] is a 87 y.o. year old female who per H&P, has a history of diabetes and hypertension who presented to the ED after being found down at home.    Last seen by family on Monday, 10/31. Family was unable to get ahold of her by phone through the week and found her lodged between her bed and wall with multiple pressure sores.   Patient is exceptionally hard of hearing and has difficulty providing additional history. States that she had a bad fall at home but cannot elaborate more than this. Family unable to be contacted by phone.   In the ED was noted to be hypotensive. Initially responded to IVF but eventually required initiation of norepinephrine. Broad spectrum abx started but no obvious source of infection noted.   She was noted to be in rhabdo with aki. She was resucitated and renal function improved. Pt was profoundly weak however and unable to mobilize any extremity. PT/OT was consulted for eval and potential for rehab. Pt also continued to intermittently go in and out of afib with rvr despite numerous amio boluses which would then decrease her pressure and she would require resumption of vasopressors to maintain map.   On 11/8 pt had large volume coffee ground  emesis. NG was placed and she was started on ppi infusion. Pt's family was contacted. She would be unable to undergo an endoscopic evaluation without intubation and pt's family had discussed and reviewed her previously expressed wishes. In her current condition (coming in as a patient who was completely independent prior to presentation) she would not want aggressive measures. She was transitioned to comfort measures. She expired with family by her side on November 01, 2023.  Medical examiner was called on 10/08/21 due to her being found down with multiple wounds but was deemed not an ME case.        Pertinent Labs and Studies  Significant Diagnostic Studies DG Pelvis 1-2 Views  Result Date: 10/27/2021 CLINICAL DATA:  Trauma.  Patient unresponsive. EXAM: PELVIS - 1-2 VIEW COMPARISON:  None. FINDINGS: No fractures are identified. IMPRESSION: Negative. Electronically Signed   By: Dorise Bullion III M.D.   On: 27-Oct-2021 19:48   DG Forearm Right  Result Date: 10/04/2021 CLINICAL DATA:  Trauma, inpatient EXAM: RIGHT FOREARM - 2 VIEW COMPARISON:  None. FINDINGS: No fracture. No suspicious focal osseous lesions. No evidence of dislocation at the wrist or elbow on these views. No radiopaque foreign bodies. Marked osteoarthritis at the first carpometacarpal joint. IMPRESSION: No fracture. Marked osteoarthritis at the first carpometacarpal joint. Electronically Signed   By: Ilona Sorrel M.D.   On: 10/04/2021 07:29   DG Tibia/Fibula Left  Result Date: 27-Oct-2021 CLINICAL DATA:  Trauma.  Patient is unresponsive. EXAM: LEFT TIBIA AND FIBULA - 2 VIEW COMPARISON:  None. FINDINGS: There is no evidence of fracture  or other focal bone lesions. Soft tissues are unremarkable. IMPRESSION: Negative. Electronically Signed   By: Dorise Bullion III M.D.   On: 10/12/2021 19:49   DG Abd 1 View  Result Date: 10/06/2021 CLINICAL DATA:  Emesis EXAM: ABDOMEN - 1 VIEW COMPARISON:  10/01/2021 FINDINGS: Nonobstructive bowel gas  pattern. No radio-opaque calculi or other significant radiographic abnormality are seen. Degenerative changes within the lumbar spine. IMPRESSION: Negative. Electronically Signed   By: Davina Poke D.O.   On: 10/06/2021 16:44   CT Head Wo Contrast  Result Date: 10/04/2021 CLINICAL DATA:  Trauma.  Patient found down. EXAM: CT HEAD WITHOUT CONTRAST CT CERVICAL SPINE WITHOUT CONTRAST TECHNIQUE: Multidetector CT imaging of the head and cervical spine was performed following the standard protocol without intravenous contrast. Multiplanar CT image reconstructions of the cervical spine were also generated. COMPARISON:  July 14, 2015 FINDINGS: CT HEAD FINDINGS Brain: No subdural, epidural, or subarachnoid hemorrhage. Cerebellum, brainstem, and basal cisterns are normal. A lacunar infarct is seen in the left caudate, new since 2016 but nonacute appearance. Scattered white matter changes are identified. A right frontal lobe infarct is identified, not present 2016 but suspected to be nonacute. No other infarct or ischemia. Ventricles and sulci are prominent but stable. No mass effect or midline shift. Vascular: Calcified atherosclerosis in the intracranial carotids. Skull: Normal. Negative for fracture or focal lesion. Sinuses/Orbits: No acute finding. Other: None. CT CERVICAL SPINE FINDINGS Alignment: Trace anterolisthesis of C4 versus C5. No other malalignment. Skull base and vertebrae: No acute fracture. No primary bone lesion or focal pathologic process. Soft tissues and spinal canal: No prevertebral fluid or swelling. No visible canal hematoma. Disc levels: Multilevel degenerative disc disease and facet degenerative changes. Upper chest: Negative. Other: No other abnormalities. IMPRESSION: 1. There is a small right frontal infarct which is age indeterminate but favored to be nonacute. A remote left caudate infarct is noted as well. No other acute abnormalities. 2. No fracture or traumatic malalignment in the  cervical spine. Multilevel degenerative changes. Electronically Signed   By: Dorise Bullion III M.D.   On: 10/02/2021 19:59   CT Cervical Spine Wo Contrast  Result Date: 10/23/2021 CLINICAL DATA:  Trauma.  Patient found down. EXAM: CT HEAD WITHOUT CONTRAST CT CERVICAL SPINE WITHOUT CONTRAST TECHNIQUE: Multidetector CT imaging of the head and cervical spine was performed following the standard protocol without intravenous contrast. Multiplanar CT image reconstructions of the cervical spine were also generated. COMPARISON:  July 14, 2015 FINDINGS: CT HEAD FINDINGS Brain: No subdural, epidural, or subarachnoid hemorrhage. Cerebellum, brainstem, and basal cisterns are normal. A lacunar infarct is seen in the left caudate, new since 2016 but nonacute appearance. Scattered white matter changes are identified. A right frontal lobe infarct is identified, not present 2016 but suspected to be nonacute. No other infarct or ischemia. Ventricles and sulci are prominent but stable. No mass effect or midline shift. Vascular: Calcified atherosclerosis in the intracranial carotids. Skull: Normal. Negative for fracture or focal lesion. Sinuses/Orbits: No acute finding. Other: None. CT CERVICAL SPINE FINDINGS Alignment: Trace anterolisthesis of C4 versus C5. No other malalignment. Skull base and vertebrae: No acute fracture. No primary bone lesion or focal pathologic process. Soft tissues and spinal canal: No prevertebral fluid or swelling. No visible canal hematoma. Disc levels: Multilevel degenerative disc disease and facet degenerative changes. Upper chest: Negative. Other: No other abnormalities. IMPRESSION: 1. There is a small right frontal infarct which is age indeterminate but favored to be nonacute. A remote left  caudate infarct is noted as well. No other acute abnormalities. 2. No fracture or traumatic malalignment in the cervical spine. Multilevel degenerative changes. Electronically Signed   By: Dorise Bullion III  M.D.   On: 10/17/2021 19:59   DG CHEST PORT 1 VIEW  Result Date: 10/06/2021 CLINICAL DATA:  Hypoxia EXAM: PORTABLE CHEST 1 VIEW COMPARISON:  10/20/2021 FINDINGS: Cardiac and mediastinal contours are within normal limits. Aortic atherosclerotic calcifications. No focal pulmonary opacity. No pleural effusion or pneumothorax. No acute osseous abnormality. IMPRESSION: No acute cardiopulmonary process. Electronically Signed   By: Merilyn Baba M.D.   On: 10/06/2021 16:44   DG Chest Port 1 View  Result Date: 09/30/2021 CLINICAL DATA:  Questionable sepsis. History of atrial fibrillation. EXAM: PORTABLE CHEST 1 VIEW COMPARISON:  July 14, 2015 FINDINGS: There is a prominent skin fold over the right lower chest. No pneumothorax. The heart, hila, mediastinum are normal. No pulmonary nodules or masses. No focal infiltrates. IMPRESSION: No acute abnormalities.  No cause for sepsis identified. Electronically Signed   By: Dorise Bullion III M.D.   On: 10/01/2021 19:47   DG Shoulder Right Port  Result Date: 10/04/2021 CLINICAL DATA:  Trauma. EXAM: PORTABLE RIGHT SHOULDER COMPARISON:  Humerus radiograph 10/26/2021 FINDINGS: No acute fracture. Glenohumeral osteoarthritis with inferior spurring. Mild acromioclavicular osteoarthritis. There is a small subacromial spur. Subcortical cystic change in the lateral humeral head. There is soft tissue thickening about the lateral shoulder. IMPRESSION: 1. No acute fracture or subluxation of the shoulder. 2. Soft tissue thickening about the lateral shoulder may represent hematoma or bursitis. 3. Mild acromioclavicular osteoarthritis and small subacromial spur. Electronically Signed   By: Keith Rake M.D.   On: 10/04/2021 16:58   DG Humerus Right  Result Date: 10/08/2021 CLINICAL DATA:  Trauma. EXAM: RIGHT HUMERUS - 2+ VIEW COMPARISON:  None. FINDINGS: Limited views of the right chest are normal. AC joint degenerative changes. Limited views of the scapula and clavicle are  otherwise normal. No humeral fracture. The proximal ulna is unremarkable on limited views. Irregularity of the radial head may represent osteophytes. If there is pain in this region, recommend dedicated images of the elbow. IMPRESSION: 1. Irregularity of the radial head may represent osteophytes. If there is pain in this region, recommend dedicated images of the elbow to exclude a radial head fracture. 2. No other abnormalities. Electronically Signed   By: Dorise Bullion III M.D.   On: 10/06/2021 19:46   ECHOCARDIOGRAM COMPLETE  Result Date: 10/05/2021    ECHOCARDIOGRAM REPORT   Patient Name:   Kelly Webster Date of Exam: 10/05/2021 Medical Rec #:  [id number]       Height:       57.0 in Accession #:    [id number]      Weight:       104.5 lb Date of Birth:  11/16/1931       BSA:          1.365 m Patient Age:    28 years        BP:           107/62 mmHg Patient Gender: F               HR:           111 bpm. Exam Location:  Inpatient Procedure: 2D Echo, Cardiac Doppler and Color Doppler Indications:    Atrial fibrillation  History:        Patient has no prior history of Echocardiogram examinations.  Arrythmias:Atrial Fibrillation, Signs/Symptoms:Shock, AKI; Risk                 Factors:Diabetes and Hypertension.  Sonographer:    Dustin Flock RDCS Referring Phys: [id number] [place]  1. Left ventricular ejection fraction, by estimation, is 60 to 65%. The left ventricle has normal function. The left ventricle has no regional wall motion abnormalities. There is mild left ventricular hypertrophy. Left ventricular diastolic parameters are indeterminate.  2. Right ventricular systolic function is normal. The right ventricular size is normal. There is mildly elevated pulmonary artery systolic pressure.  3. The mitral valve is abnormal. Mild mitral valve regurgitation. No evidence of mitral stenosis. Severe mitral annular calcification.  4. Tricuspid valve regurgitation is moderate.   5. The aortic valve is tricuspid. There is moderate calcification of the aortic valve. There is moderate thickening of the aortic valve. Aortic valve regurgitation is mild. Mild aortic valve stenosis.  6. The inferior vena cava is dilated in size with >50% respiratory variability, suggesting right atrial pressure of 8 mmHg. FINDINGS  Left Ventricle: Left ventricular ejection fraction, by estimation, is 60 to 65%. The left ventricle has normal function. The left ventricle has no regional wall motion abnormalities. The left ventricular internal cavity size was normal in size. There is  mild left ventricular hypertrophy. Left ventricular diastolic parameters are indeterminate. Right Ventricle: The right ventricular size is normal. No increase in right ventricular wall thickness. Right ventricular systolic function is normal. There is mildly elevated pulmonary artery systolic pressure. The tricuspid regurgitant velocity is 2.77  m/s, and with an assumed right atrial pressure of 8 mmHg, the estimated right ventricular systolic pressure is 24.5 mmHg. Left Atrium: Left atrial size was normal in size. Right Atrium: Right atrial size was normal in size. Pericardium: There is no evidence of pericardial effusion. Mitral Valve: The mitral valve is abnormal. There is mild thickening of the mitral valve leaflet(s). There is mild calcification of the mitral valve leaflet(s). Severe mitral annular calcification. Mild mitral valve regurgitation. No evidence of mitral valve stenosis. Tricuspid Valve: The tricuspid valve is normal in structure. Tricuspid valve regurgitation is moderate . No evidence of tricuspid stenosis. Aortic Valve: The aortic valve is tricuspid. There is moderate calcification of the aortic valve. There is moderate thickening of the aortic valve. Aortic valve regurgitation is mild. Aortic regurgitation PHT measures 209 msec. Mild aortic stenosis is present. Aortic valve mean gradient measures 10.0 mmHg. Aortic  valve peak gradient measures 13.3 mmHg. Aortic valve area, by VTI measures 1.92 cm. Pulmonic Valve: The pulmonic valve was normal in structure. Pulmonic valve regurgitation is not visualized. No evidence of pulmonic stenosis. Aorta: The aortic root is normal in size and structure. Venous: The inferior vena cava is dilated in size with greater than 50% respiratory variability, suggesting right atrial pressure of 8 mmHg. IAS/Shunts: No atrial level shunt detected by color flow Doppler.  LEFT VENTRICLE PLAX 2D LVIDd:         3.44 cm   Diastology LVIDs:         2.12 cm   LV e' medial:    7.94 cm/s LV PW:         1.33 cm   LV E/e' medial:  12.2 LV IVS:        1.12 cm   LV e' lateral:   9.68 cm/s LVOT diam:     2.00 cm   LV E/e' lateral: 10.0 LV SV:  60 LV SV Index:   44 LVOT Area:     3.14 cm  RIGHT VENTRICLE RV Basal diam:  2.68 cm RV S prime:     11.30 cm/s TAPSE (M-mode): 1.6 cm LEFT ATRIUM             Index LA diam:        3.60 cm 2.64 cm/m LA Vol (A2C):   40.1 ml 29.38 ml/m LA Vol (A4C):   35.1 ml 25.71 ml/m LA Biplane Vol: 38.6 ml 28.28 ml/m  AORTIC VALVE AV Area (Vmax):    1.79 cm AV Area (Vmean):   1.57 cm AV Area (VTI):     1.92 cm AV Vmax:           182.50 cm/s AV Vmean:          145.000 cm/s AV VTI:            0.311 m AV Peak Grad:      13.3 mmHg AV Mean Grad:      10.0 mmHg LVOT Vmax:         104.00 cm/s LVOT Vmean:        72.400 cm/s LVOT VTI:          0.190 m LVOT/AV VTI ratio: 0.61 AI PHT:            209 msec  AORTA Ao Root diam: 3.20 cm MITRAL VALVE               TRICUSPID VALVE MV Area (PHT): 3.19 cm    TR Peak grad:   30.7 mmHg MV Decel Time: 238 msec    TR Vmax:        277.00 cm/s MV E velocity: 97.00 cm/s MV A velocity: 18.40 cm/s  SHUNTS MV E/A ratio:  5.27        Systemic VTI:  0.19 m                            Systemic Diam: 2.00 cm Jenkins Rouge MD Electronically signed by Jenkins Rouge MD Signature Date/Time: 10/05/2021/1:32:45 PM    Final     Microbiology Recent Results (from  the past 240 hour(s))  Blood Culture (routine x 2)     Status: None   Collection Time: 10/02/2021  6:33 PM   Specimen: BLOOD  Result Value Ref Range Status   Specimen Description BLOOD RIGHT ANTECUBITAL  Final   Special Requests   Final    BOTTLES DRAWN AEROBIC AND ANAEROBIC Blood Culture results may not be optimal due to an inadequate volume of blood received in culture bottles   Culture   Final    NO GROWTH 5 DAYS Performed at [facility], [address] [place], [place] [address]    Report Status 10/08/2021 FINAL  Final  Blood Culture (routine x 2)     Status: None   Collection Time: 10/22/2021  6:48 PM   Specimen: BLOOD LEFT FOREARM  Result Value Ref Range Status   Specimen Description BLOOD LEFT FOREARM  Final   Special Requests   Final    BOTTLES DRAWN AEROBIC AND ANAEROBIC Blood Culture adequate volume   Culture   Final    NO GROWTH 5 DAYS Performed at [facility], [place] [address], [place], [place] [address]    Report Status 10/08/2021 FINAL  Final  Urine Culture     Status: Abnormal   Collection Time: 10/15/2021  8:11 PM   Specimen: In/Out Cath Urine  Result Value Ref Range Status   Specimen Description IN/OUT CATH URINE  Final   Special Requests   Final    NONE Performed at [facility], [address] [place], [place] [address]    Culture 1,000 COLONIES/mL STAPHYLOCOCCUS EPIDERMIDIS (A)  Final   Report Status 10/06/2021 FINAL  Final   Organism ID, Bacteria STAPHYLOCOCCUS EPIDERMIDIS (A)  Final      Susceptibility   Staphylococcus epidermidis - MIC*    CIPROFLOXACIN <=0.5 SENSITIVE Sensitive     GENTAMICIN <=0.5 SENSITIVE Sensitive     NITROFURANTOIN <=16 SENSITIVE Sensitive     OXACILLIN <=0.25 SENSITIVE Sensitive     TETRACYCLINE <=1 SENSITIVE Sensitive     VANCOMYCIN 2 SENSITIVE Sensitive     TRIMETH/SULFA <=10 SENSITIVE Sensitive     CLINDAMYCIN <=0.25 SENSITIVE Sensitive     RIFAMPIN <=0.5 SENSITIVE Sensitive     Inducible Clindamycin  NEGATIVE Sensitive     * 1,000 COLONIES/mL STAPHYLOCOCCUS EPIDERMIDIS  MRSA Next Gen by PCR, Nasal     Status: None   Collection Time: 10/04/21  1:39 AM   Specimen: Nasal Mucosa; Nasal Swab  Result Value Ref Range Status   MRSA by PCR Next Gen NOT DETECTED NOT DETECTED Final    Comment: (NOTE) The GeneXpert MRSA Assay (FDA approved for NASAL specimens only), is one component of a comprehensive MRSA colonization surveillance program. It is not intended to diagnose MRSA infection nor to guide or monitor treatment for MRSA infections. Test performance is not FDA approved in patients less than 49 years old. Performed at [facility], [place] [address], [place], [place] [address]     Lab Basic Metabolic Panel: Recent Labs  Lab 10/22/2021 1815 10/04/2021 1859 10/04/21 0214 10/04/21 2153 10/05/21 0307 10/06/21 0303 10/06/21 1530 2021/11/03 0146  NA 131*   < >  --  132* 132* 134* 136 139  K 3.8   < >  --  3.3* 3.4* 4.4 3.7 3.7  CL 95*   < >  --  99 102 101 95* 94*  CO2 22   < >  --  24 23 23 29  34*  GLUCOSE 146*   < >  --  149* 124* 124* 112* 96  BUN 99*   < >  --  74* 75* 78* 86* 89*  CREATININE 2.19*   < >  --  1.31* 1.24* 1.25* 1.45* 1.51*  CALCIUM 10.5*   < >  --  10.0 9.9 10.1 10.7* 10.1  MG 3.1*  --  2.5* 2.3  --  2.1  --   --   PHOS  --   --  3.2  --   --   --   --   --    < > = values in this interval not displayed.   Liver Function Tests: Recent Labs  Lab 10/02/2021 1815  AST 116*  ALT 73*  ALKPHOS 66  BILITOT 0.7  PROT 6.0*  ALBUMIN 2.7*   No results for input(s): LIPASE, AMYLASE in the last 168 hours. No results for input(s): AMMONIA in the last 168 hours. CBC: Recent Labs  Lab 10/14/2021 1815 10/13/2021 1859 10/05/21 0307 10/06/21 0303 10/06/21 1631 2021/11/03 0146  WBC 21.9*  --  16.0* 14.3* 12.3* 15.8*  NEUTROABS 19.8*  --   --   --   --   --   HGB 10.5* 10.9* 8.6* 8.0* 7.1* 5.5*  HCT 30.2*  32.0* 23.8* 22.3* 19.0* 15.7*  MCV 91.5  --  89.8 90.7 88.4 89.7   PLT 329  --  252 263 239 204   Cardiac Enzymes: Recent Labs  Lab 10/26/2021 1815 10/04/21 0258 10/05/21 0307  CKTOTAL 2,841* 2,189* 715*   Sepsis Labs: Recent Labs  Lab 09/30/2021 2117 10/04/21 0214 10/05/21 0307 10/06/21 0303 10/06/21 1631 2021-11-03 0146  WBC  --   --  16.0* 14.3* 12.3* 15.8*  LATICACIDVEN 1.7 1.0  --   --   --   --     Procedures/Operations  See emr   Audria Nine 10/08/2021, 5:00 PM

## 2021-10-29 NOTE — Progress Notes (Signed)
NAME:  Kelly Webster, MRN:  [id number], DOB:  1931-08-02, LOS: 4 ADMISSION DATE:  09/29/2021, CONSULTATION DATE:  10/04/2021 REFERRING MD:  EDP, CHIEF COMPLAINT:  Shock   History of Present Illness:  [name] is an 87 year old female with PMHx of hypertension and diabetes who presented to the ED after being found down at home. Last seen by family on 10/31, family found her lodged between her bed and wall with multiple pressure sores on 11/5 and brought her in to the ED.  Patient is hard of hearing and has difficulty providing addisional history. She states that she had a bad fall at home but unable to further elaborate.  In the ED, patient noted to be hypotensive. Initially responded to IVF but eventually required initiation of norepinephrine. Broad spectrum antibiotics started but no obvious source of infection ntoed.   Pertinent  Medical History   Past Medical History:  Diagnosis Date   Arthritis    gouty arthritis knees and hands   Diabetes mellitus without complication ([place])    Hx of seasonal allergies    Hypertension    Significant Hospital Events: Including procedures, antibiotic start and stop dates in addition to other pertinent events   11/6> admitted to ICU in setting of shock 11/7> continuing to require pressor support  11/8 > able to wean off levophed throughout the day; however, developed acute hypoxic respiratory failure secondary to aspiration and inability to protect airway. Acute GI bleed noted for which NGT placed. Discussed with patient's POA and decision made for comfort care  Interim History / Subjective:  Overnight, no acute events  This morning, patient is continued on 6L HFNC. NGT in place draining dark blood.   Objective   Blood pressure (!) 116/46, pulse 94, temperature 98.7 F (37.1 C), temperature source Axillary, resp. rate 13, height 4\' 9"  (1.448 m), weight 48.1 kg, SpO2 95 %.        Intake/Output Summary (Last 24 hours) at 19-Oct-2021  0731 Last data filed at 10-19-21 0700 Gross per 24 hour  Intake 1585.82 ml  Output 3070 ml  Net -1484.18 ml   Filed Weights   10/05/21 0515 10/06/21 0500 10/19/21 0500  Weight: 47.4 kg 48.1 kg 48.1 kg    Examination: General: chronically ill appearing female; not in acute distress HENT: Normocephalic; 2cm lesion above the right eyebrow with dried blood; bruising noted on the left cheek. EOMI, PERRL, dry mucus membranes Lungs: CTAB, on room air Cardiovascular: Irregularly irregular, tachycardic, S1 and S2 present Abdomen: soft, nondistended, nontender, +BS Extremities: diffuse anasarca;  warm and dry; diffuse muscle rigidity Neuro: alert and oriented x3, mildly encephalopathic but unclear if this is baseline   Resolved Hospital Problem list    Assessment & Plan:  Hypovolemic and hemorrhagic shock  Acute hypoxic respiratory failure 2/2 aspiration pneumonia  Patient presented with hypovolemic shock in setting of being found down for >48 hours. She has received 5+L IVF since admission. Patient was weaned off of levophed on 11/8. However, in the evening, started having copious amounts of brown emesis, suspected to be in setting of GI bleed. New oxygen requirement up to 15L HFNC in setting of aspiration pneumonia. Following discussions with family regarding patient's condition and prognosis, decision made to transition to comfort care as patient would not want any life prolonging measures and she is not currently stable enough to undergo endoscopy.  - Comfort care orders placed   Atrial fibrillation - currently rate controlled  No hx of  atrial fibrillation. This is likely in setting of shock as above with levophed. Currently rate controlled. CHADs2-VASc score 5; HASBLED score 2 with history of frequent falls; leading to increased risk of bleeding. Noted to have acute GI bleed. Heparin gtt discontinued.  - PO amiodarone   Acute renal failure, oliguric Hypokalemia Rhabdomyolysis  Serum  creatinine and CK improving with fluid resuscitation. Minimal urine production on 11/8.   Best Practice (right click and "Reselect all SmartList Selections" daily)   Diet/type: NPO DVT prophylaxis: not indicated GI prophylaxis: PPI Lines: N/A Foley:  Yes, and it is still needed Code Status:  DNR Last date of multidisciplinary goals of care discussion [family updated 11/8]  Labs   CBC: Recent Labs  Lab 10/21/2021 1815 10/02/2021 1859 10/05/21 0307 10/06/21 0303 10/06/21 1631 2021-10-20 0146  WBC 21.9*  --  16.0* 14.3* 12.3* 15.8*  NEUTROABS 19.8*  --   --   --   --   --   HGB 10.5* 10.9* 8.6* 8.0* 7.1* 5.5*  HCT 30.2* 32.0* 23.8* 22.3* 19.0* 15.7*  MCV 91.5  --  89.8 90.7 88.4 89.7  PLT 329  --  252 263 239 470    Basic Metabolic Panel: Recent Labs  Lab 10/19/2021 1815 10/22/2021 1859 10/04/21 0214 10/04/21 2153 10/05/21 0307 10/06/21 0303 10/06/21 1530 10/20/21 0146  NA 131*   < >  --  132* 132* 134* 136 139  K 3.8   < >  --  3.3* 3.4* 4.4 3.7 3.7  CL 95*   < >  --  99 102 101 95* 94*  CO2 22   < >  --  24 23 23 29  34*  GLUCOSE 146*   < >  --  149* 124* 124* 112* 96  BUN 99*   < >  --  74* 75* 78* 86* 89*  CREATININE 2.19*   < >  --  1.31* 1.24* 1.25* 1.45* 1.51*  CALCIUM 10.5*   < >  --  10.0 9.9 10.1 10.7* 10.1  MG 3.1*  --  2.5* 2.3  --  2.1  --   --   PHOS  --   --  3.2  --   --   --   --   --    < > = values in this interval not displayed.   GFR: Estimated Creatinine Clearance: 17.2 mL/min (A) (by C-G formula based on SCr of 1.51 mg/dL (H)). Recent Labs  Lab 10/09/2021 2117 10/04/21 0214 10/05/21 0307 10/06/21 0303 10/06/21 1631 Oct 20, 2021 0146  WBC  --   --  16.0* 14.3* 12.3* 15.8*  LATICACIDVEN 1.7 1.0  --   --   --   --     Liver Function Tests: Recent Labs  Lab 10/04/2021 1815  AST 116*  ALT 73*  ALKPHOS 66  BILITOT 0.7  PROT 6.0*  ALBUMIN 2.7*   No results for input(s): LIPASE, AMYLASE in the last 168 hours. No results for input(s): AMMONIA in  the last 168 hours.  ABG    Component Value Date/Time   HCO3 23.6 10/14/2021 1859   TCO2 25 10/15/2021 1859   ACIDBASEDEF 2.0 10/01/2021 1859   O2SAT 84.0 10/02/2021 1859     Coagulation Profile: Recent Labs  Lab 10/13/2021 1815  INR 1.3*    Cardiac Enzymes: Recent Labs  Lab 10/02/2021 1815 10/04/21 0258 10/05/21 0307  CKTOTAL 2,841* 2,189* 715*    HbA1C: Hgb A1c MFr Bld  Date/Time Value Ref Range Status  10/04/2021 03:27 AM 5.7 (H) 4.8 - 5.6 % Final    Comment:    (NOTE) Pre diabetes:          5.7%-6.4%  Diabetes:              >6.4%  Glycemic control for   <7.0% adults with diabetes   07/16/2015 05:25 AM 6.1 (H) 4.8 - 5.6 % Final    Comment:    (NOTE)         Pre-diabetes: 5.7 - 6.4         Diabetes: >6.4         Glycemic control for adults with diabetes: <7.0     CBG: Recent Labs  Lab 10/06/21 1112 10/06/21 1508 10/06/21 1913 10/06/21 2314 10/25/2021 0308  GLUCAP 133* 110* 93 84 91    Critical care time: 35 mins    Harvie Heck, MD Internal Medicine, PGY-3 25-Oct-2021 7:31 AM Pager # [phone]

## 2021-10-29 NOTE — Progress Notes (Signed)
Patient passed at 73.  Family was at bedside.  Pronounced by Burna Mortimer, RN and Colbert Coyer, RN.  Patient belongings returned to family.  Body has been transferred to morgue.

## 2021-10-29 DEATH — deceased
